# Patient Record
Sex: Female | Born: 1990 | Race: White | Hispanic: No | Smoking: Current every day smoker
Health system: Southern US, Community
[De-identification: ages and names within clinical notes are randomized; demographics above are authoritative.]

## PROBLEM LIST (undated history)

## (undated) ENCOUNTER — Inpatient Hospital Stay (HOSPITAL_COMMUNITY): Payer: Self-pay

## (undated) DIAGNOSIS — F32A Depression, unspecified: Secondary | ICD-10-CM

## (undated) DIAGNOSIS — F419 Anxiety disorder, unspecified: Secondary | ICD-10-CM

## (undated) DIAGNOSIS — F329 Major depressive disorder, single episode, unspecified: Secondary | ICD-10-CM

---

## 2016-09-05 ENCOUNTER — Emergency Department (HOSPITAL_BASED_OUTPATIENT_CLINIC_OR_DEPARTMENT_OTHER): Payer: 59

## 2016-09-05 ENCOUNTER — Emergency Department (HOSPITAL_BASED_OUTPATIENT_CLINIC_OR_DEPARTMENT_OTHER)
Admission: EM | Admit: 2016-09-05 | Discharge: 2016-09-06 | Disposition: A | Discharge status: Home / Self Care | Payer: 59 | Attending: Emergency Medicine | Admitting: Emergency Medicine

## 2016-09-05 ENCOUNTER — Encounter (HOSPITAL_BASED_OUTPATIENT_CLINIC_OR_DEPARTMENT_OTHER): Payer: Self-pay | Admitting: Emergency Medicine

## 2016-09-05 DIAGNOSIS — N83201 Unspecified ovarian cyst, right side: Secondary | ICD-10-CM

## 2016-09-05 DIAGNOSIS — F17200 Nicotine dependence, unspecified, uncomplicated: Secondary | ICD-10-CM | POA: Diagnosis not present

## 2016-09-05 DIAGNOSIS — R102 Pelvic and perineal pain: Secondary | ICD-10-CM | POA: Diagnosis present

## 2016-09-05 HISTORY — DX: Anxiety disorder, unspecified: F41.9

## 2016-09-05 HISTORY — DX: Depression, unspecified: F32.A

## 2016-09-05 HISTORY — DX: Major depressive disorder, single episode, unspecified: F32.9

## 2016-09-05 LAB — URINALYSIS, ROUTINE W REFLEX MICROSCOPIC
BILIRUBIN URINE: NEGATIVE
GLUCOSE, UA: NEGATIVE mg/dL
HGB URINE DIPSTICK: NEGATIVE
Ketones, ur: NEGATIVE mg/dL
Leukocytes, UA: NEGATIVE
Nitrite: NEGATIVE
PH: 6 (ref 5.0–8.0)
Protein, ur: NEGATIVE mg/dL
SPECIFIC GRAVITY, URINE: 1.028 (ref 1.005–1.030)

## 2016-09-05 LAB — PREGNANCY, URINE: PREG TEST UR: NEGATIVE

## 2016-09-05 LAB — WET PREP, GENITAL
Sperm: NONE SEEN
TRICH WET PREP: NONE SEEN
YEAST WET PREP: NONE SEEN

## 2016-09-05 NOTE — ED Provider Notes (Signed)
[facility] Provider Note   CSN: [id number] Arrival date & time: 09/05/16  2307 By signing my name below, I, Levon Hedger, attest that this documentation has been prepared under the direction and in the presence of Paula Libra, MD . Electronically Signed: Levon Hedger, Scribe. 09/06/2016. 1:05 AM.   History   Chief Complaint Chief Complaint  Patient presents with  . Pelvic Pain    HPI Cindy Harris is a 26 y.o. female with About a 24 hour history of waxing and waning suprapubic pain. She describes her pain as severe at times, worse with palpation, movement and ambulation. Pt has never experienced pain like this before. No alleviating factors noted. She states her ex-boyfriend was positive for chlamydia; pt was seen and treated for chlamydia and gonorrhea two weeks ago at [facility]; her STD probe was positive for chlamydia. Pt denies vaginal bleeding, discharge, dysuria, vomiting, diarrhea, fever and chills.   The history is provided by the patient. No language interpreter was used.   Past Medical History:  Diagnosis Date  . Anxiety   . Depression     There are no active problems to display for this patient.   History reviewed. No pertinent surgical history.  OB History    No data available       Home Medications    Prior to Admission medications   Not on File    Family History No family history on file.  Social History Social History  Substance Use Topics  . Smoking status: Current Every Day Smoker    Packs/day: 0.50  . Smokeless tobacco: Never Used  . Alcohol use Yes     Comment: occ     Allergies   Review of patient's allergies indicates no known allergies.   Review of Systems Review of Systems 10 systems reviewed and all are negative for acute change except as noted in the HPI.  Physical Exam Updated Vital Signs BP 100/70 (BP Location: Right Arm)   Pulse 73   Temp 97.9 F (36.6 C) (Oral)   Resp 18   Ht 4\' 11"   (1.499 m)   Wt 90 lb (40.8 kg)   LMP 08/11/2016 (Exact Date)   SpO2 99%   BMI 18.18 kg/m   Physical Exam General: Well-developed, well-nourished female in no acute distress; appearance consistent with age of record HENT: normocephalic; atraumatic Eyes: pupils equal, round and reactive to light; extraocular muscles intact Neck: supple Heart: regular rate and rhythm.  Lungs: clear to auscultation bilaterally Abdomen: soft; nondistended; suprapubic tenderness; no masses or hepatosplenomegaly; bowel sounds present GU: Chaperone (nurse) was present for exam which was performed with no complications; normal external genital; no vaginal bleeding; no vaginal discharge; mild cervical motion tenderness; left adnexal tenderness  Extremities: No deformity; full range of motion; pulses normal Neurologic: Awake, alert and oriented; motor function intact in all extremities and symmetric; no facial droop Skin: Warm and dry Psychiatric: Normal mood and affect   ED Treatments / Results  DIAGNOSTIC STUDIES:  Oxygen Saturation is 100% on RA, normal by my interpretation.    COORDINATION OF CARE:  11:36 PM Will order US pelvis and US transvaginal non-ob. Discussed treatment plan with pt at bedside and pt agreed to plan.   Nursing notes and vitals signs, including pulse oximetry, reviewed.  Summary of this visit's results, reviewed by myself:  Labs:  Results for orders placed or performed during the hospital encounter of 09/05/16 (from the past 24 hour(s))  Urinalysis, Routine w  reflex microscopic (not at [facility])     Status: None   Collection Time: 09/05/16 11:20 PM  Result Value Ref Range   Color, Urine YELLOW YELLOW   APPearance CLEAR CLEAR   Specific Gravity, Urine 1.028 1.005 - 1.030   pH 6.0 5.0 - 8.0   Glucose, UA NEGATIVE NEGATIVE mg/dL   Hgb urine dipstick NEGATIVE NEGATIVE   Bilirubin Urine NEGATIVE NEGATIVE   Ketones, ur NEGATIVE NEGATIVE mg/dL   Protein, ur NEGATIVE NEGATIVE mg/dL    Nitrite NEGATIVE NEGATIVE   Leukocytes, UA NEGATIVE NEGATIVE  Pregnancy, urine     Status: None   Collection Time: 09/05/16 11:20 PM  Result Value Ref Range   Preg Test, Ur NEGATIVE NEGATIVE  Wet prep, genital     Status: Abnormal   Collection Time: 09/05/16 11:30 PM  Result Value Ref Range   Yeast Wet Prep HPF POC NONE SEEN NONE SEEN   Trich, Wet Prep NONE SEEN NONE SEEN   Clue Cells Wet Prep HPF POC PRESENT (A) NONE SEEN   WBC, Wet Prep HPF POC MODERATE (A) NONE SEEN   Sperm NONE SEEN     Imaging Studies: US Transvaginal Non-ob  Result Date: 09/06/2016 CLINICAL DATA:  26 year old female with left pelvic pain. EXAM: TRANSABDOMINAL AND TRANSVAGINAL ULTRASOUND OF PELVIS TECHNIQUE: Both transabdominal and transvaginal ultrasound examinations of the pelvis were performed. Transabdominal technique was performed for global imaging of the pelvis including uterus, ovaries, adnexal regions, and pelvic cul-de-sac. It was necessary to proceed with endovaginal exam following the transabdominal exam to visualize the endometrium and the ovaries. COMPARISON:  None FINDINGS: Uterus Measurements: Retroverted measuring 6 x 4 x 5 cm. No fibroids or other mass visualized. Endometrium Thickness: 1 mm.  No focal abnormality visualized. Right ovary Measurements: 4.0 x 2.7 x 3.5 cm. A 2.3 x 1.4 x 1.7 cm complex cyst/ follicle. Left ovary Measurements: 4.0 x 2.0 x 2.7 cm. Normal appearance/no adnexal mass. Other findings Small amount of complex fluid with low-level echogenic debris noted predominantly surrounding the left ovary. IMPRESSION: Small right ovarian complex/ hemorrhagic cyst and small amount of complex fluid within the pelvis. Unremarkable uterus and left ovary. Electronically Signed   By: Elgie Collard M.D.   On: 09/06/2016 00:26   US Pelvis Complete  Result Date: 09/06/2016 CLINICAL DATA:  26 year old female with left pelvic pain. EXAM: TRANSABDOMINAL AND TRANSVAGINAL ULTRASOUND OF PELVIS TECHNIQUE:  Both transabdominal and transvaginal ultrasound examinations of the pelvis were performed. Transabdominal technique was performed for global imaging of the pelvis including uterus, ovaries, adnexal regions, and pelvic cul-de-sac. It was necessary to proceed with endovaginal exam following the transabdominal exam to visualize the endometrium and the ovaries. COMPARISON:  None FINDINGS: Uterus Measurements: Retroverted measuring 6 x 4 x 5 cm. No fibroids or other mass visualized. Endometrium Thickness: 1 mm.  No focal abnormality visualized. Right ovary Measurements: 4.0 x 2.7 x 3.5 cm. A 2.3 x 1.4 x 1.7 cm complex cyst/ follicle. Left ovary Measurements: 4.0 x 2.0 x 2.7 cm. Normal appearance/no adnexal mass. Other findings Small amount of complex fluid with low-level echogenic debris noted predominantly surrounding the left ovary. IMPRESSION: Small right ovarian complex/ hemorrhagic cyst and small amount of complex fluid within the pelvis. Unremarkable uterus and left ovary. Electronically Signed   By: Elgie Collard M.D.   On: 09/06/2016 00:26    Procedures (including critical care time)   Final Clinical Impressions(s) / ED Diagnoses   Final diagnoses:  Cyst of right ovary  I personally performed the services described in this documentation, which was scribed in my presence. The recorded information has been reviewed and is accurate.    Paula LibraJohn Ragina Fenter, MD 09/06/16 [phone]

## 2016-09-05 NOTE — ED Triage Notes (Signed)
Pt treated 2 weeks ago for Chlamydia.  Today is having severe suprapubic pain that she states "feels like its in my cervix".  Pain worse when walking.

## 2016-09-06 MED ORDER — TRAMADOL HCL 50 MG PO TABS: 50.0000 mg | ORAL_TABLET | Freq: Four times a day (QID) | ORAL | 0 refills | Status: DC | PRN

## 2016-09-06 MED ORDER — TRAMADOL HCL 50 MG PO TABS
50.0000 mg | ORAL_TABLET | Freq: Once | ORAL | Status: AC
  Administered 2016-09-06: 50 mg via ORAL
  Filled 2016-09-06: qty 1

## 2016-09-07 LAB — GC/CHLAMYDIA PROBE AMP (~~LOC~~) NOT AT ARMC
Chlamydia: POSITIVE — AB
NEISSERIA GONORRHEA: NEGATIVE

## 2016-09-10 ENCOUNTER — Telehealth (HOSPITAL_COMMUNITY): Payer: Self-pay

## 2016-09-10 NOTE — Telephone Encounter (Signed)
[facility].  (+) Chlamydia.  No antibiotic treatment or prescription given for STD.  Chart to MD office for review.  [facility] form attached.

## 2016-09-12 ENCOUNTER — Telehealth (HOSPITAL_COMMUNITY): Payer: Self-pay

## 2016-09-12 NOTE — Telephone Encounter (Signed)
Chart reviewed by Dr Fredderick PhenixBelfi "Zithromax 1 gram po"  [facility] form completed and faxed.    LVM requesting call back.  [facility]

## 2016-10-04 ENCOUNTER — Encounter (HOSPITAL_BASED_OUTPATIENT_CLINIC_OR_DEPARTMENT_OTHER): Payer: Self-pay | Admitting: *Deleted

## 2016-10-04 ENCOUNTER — Emergency Department (HOSPITAL_BASED_OUTPATIENT_CLINIC_OR_DEPARTMENT_OTHER)
Admission: EM | Admit: 2016-10-04 | Discharge: 2016-10-04 | Disposition: A | Discharge status: Home / Self Care | Payer: 59 | Attending: Emergency Medicine | Admitting: Emergency Medicine

## 2016-10-04 DIAGNOSIS — M542 Cervicalgia: Secondary | ICD-10-CM | POA: Diagnosis present

## 2016-10-04 DIAGNOSIS — F17200 Nicotine dependence, unspecified, uncomplicated: Secondary | ICD-10-CM | POA: Insufficient documentation

## 2016-10-04 DIAGNOSIS — M436 Torticollis: Secondary | ICD-10-CM | POA: Insufficient documentation

## 2016-10-04 MED ORDER — OXYCODONE HCL 5 MG PO TABS
5.0000 mg | ORAL_TABLET | Freq: Once | ORAL | Status: AC
  Administered 2016-10-04: 5 mg via ORAL
  Filled 2016-10-04: qty 1

## 2016-10-04 MED ORDER — ACETAMINOPHEN 500 MG PO TABS
1000.0000 mg | ORAL_TABLET | Freq: Once | ORAL | Status: AC
  Administered 2016-10-04: 1000 mg via ORAL
  Filled 2016-10-04: qty 2

## 2016-10-04 MED ORDER — IBUPROFEN 800 MG PO TABS
800.0000 mg | ORAL_TABLET | Freq: Once | ORAL | Status: AC
  Administered 2016-10-04: 800 mg via ORAL
  Filled 2016-10-04: qty 1

## 2016-10-04 MED ORDER — DIAZEPAM 5 MG PO TABS
5.0000 mg | ORAL_TABLET | Freq: Once | ORAL | Status: AC
  Administered 2016-10-04: 5 mg via ORAL
  Filled 2016-10-04: qty 1

## 2016-10-04 NOTE — ED Triage Notes (Signed)
Pain in her left neck when she moves her neck. Pain x 4 days. No fever. She feels like she slept wrong but the pain is not getting better with time.

## 2016-10-04 NOTE — ED Provider Notes (Signed)
[facility]   CSN: [id number] Arrival date & time: 10/04/16  2046  By signing my name below, I, Emmanuella Mensah, attest that this documentation has been prepared under the direction and in the presence of Melene Planan Kendale Rembold, DO. Electronically Signed: Angelene GiovanniEmmanuella Mensah, ED Scribe. 10/04/16. 9:36 PM.   History   Chief Complaint Chief Complaint  Patient presents with  . Neck Pain    HPI Comments: Cindy Harris is a 26 y.o. female who presents to the Emergency Department complaining of gradually worsening moderate left lateral neck pain with stiffness onset 3 days ago. She reports associated pain with ROM and intermittent left shoulder pain but pt states that could be due to the way she is holding her head up. No alleviating factors noted. Pt has not tried any medications PTA. She denies any recent falls, injuries, or trauma but states that she initially believed that she slept on her neck the wrong way. Pt has NKDA. She denies any fever, chills, or headaches.  The history is provided by the patient. No language interpreter was used.  Neck Pain   This is a new problem. The current episode started more than 2 days ago. The problem has been gradually worsening. There has been no fever. The pain is present in the left side. The pain is moderate. The pain is the same all the time. Stiffness is present all day. Pertinent negatives include no chest pain and no headaches. She has tried nothing for the symptoms.    Past Medical History:  Diagnosis Date  . Anxiety   . Depression     There are no active problems to display for this patient.   History reviewed. No pertinent surgical history.  OB History    No data available       Home Medications    Prior to Admission medications   Medication Sig Start Date End Date Taking? Authorizing Provider  traMADol (ULTRAM) 50 MG tablet Take 1-2 tablets (50-100 mg total) by mouth every 6 (six) hours as needed (for pain).  09/06/16   Paula LibraJohn Molpus, MD    Family History No family history on file.  Social History Social History  Substance Use Topics  . Smoking status: Current Every Day Smoker    Packs/day: 0.50  . Smokeless tobacco: Never Used  . Alcohol use Yes     Comment: occ     Allergies   Review of patient's allergies indicates no known allergies.   Review of Systems Review of Systems  Constitutional: Negative for chills and fever.  HENT: Negative for congestion and rhinorrhea.   Eyes: Negative for redness and visual disturbance.  Respiratory: Negative for shortness of breath and wheezing.   Cardiovascular: Negative for chest pain and palpitations.  Gastrointestinal: Negative for nausea and vomiting.  Genitourinary: Negative for dysuria and urgency.  Musculoskeletal: Positive for neck pain. Negative for arthralgias and myalgias.  Skin: Negative for pallor and wound.  Neurological: Negative for dizziness and headaches.     Physical Exam Updated Vital Signs BP 112/72   Pulse 90   Temp 97.6 F (36.4 C) (Oral)   Resp 16   Ht 4\' 11"  (1.499 m)   Wt 94 lb (42.6 kg)   LMP 09/08/2016   SpO2 98%   BMI 18.99 kg/m   Physical Exam  Constitutional: She is oriented to person, place, and time. She appears well-developed and well-nourished. No distress.  HENT:  Head: Normocephalic and atraumatic.  Eyes: EOM are normal. Pupils are  equal, round, and reactive to light.  Neck: Normal range of motion. Neck supple.  Cardiovascular: Normal rate and regular rhythm.  Exam reveals no gallop and no friction rub.   No murmur heard. Pulmonary/Chest: Effort normal. She has no wheezes. She has no rales.  Abdominal: Soft. She exhibits no distension. There is no tenderness.  Musculoskeletal: She exhibits tenderness. She exhibits no edema.  Tenderness and spasm about left SEM. No midline spinal tenderness  Neurological: She is alert and oriented to person, place, and time.  Skin: Skin is warm and dry. She  is not diaphoretic.  Psychiatric: She has a normal mood and affect. Her behavior is normal.  Nursing note and vitals reviewed.    ED Treatments / Results  DIAGNOSTIC STUDIES: Oxygen Saturation is 98% on RA, normal by my interpretation.    COORDINATION OF CARE: 9:34 PM- Pt advised of plan for treatment and pt agrees. Pt will receive Valium. Will provide resources for neck exercises. Advised to follow up with PCP.    Labs (all labs ordered are listed, but only abnormal results are displayed) Labs Reviewed - No data to display  EKG  EKG Interpretation None       Radiology No results found.  Procedures Procedures (including critical care time)  Medications Ordered in ED Medications  acetaminophen (TYLENOL) tablet 1,000 mg (1,000 mg Oral Given 10/04/16 2141)  ibuprofen (ADVIL,MOTRIN) tablet 800 mg (800 mg Oral Given 10/04/16 2141)  oxyCODONE (Oxy IR/ROXICODONE) immediate release tablet 5 mg (5 mg Oral Given 10/04/16 2141)  diazepam (VALIUM) tablet 5 mg (5 mg Oral Given 10/04/16 2141)     Initial Impression / Assessment and Plan / ED Course  Melene Plan, DO has reviewed the triage vital signs and the nursing notes.  Pertinent labs & imaging results that were available during my care of the patient were reviewed by me and considered in my medical decision making (see chart for details).  Clinical Course    26 yo F With torticollis. Going on for the past few days. Patient with significant pain and spasm to the left SCM. Attempted a muscle energy maneuver but the patient is unable to tolerate, will d/c home.   9:48 PM:  I have discussed the diagnosis/risks/treatment options with the patient and family and believe the pt to be eligible for discharge home to follow-up with PCP. We also discussed returning to the ED immediately if new or worsening sx occur. We discussed the sx which are most concerning (e.g., sudden worsening pain, fever, inability to tolerate by mouth) that  necessitate immediate return. Medications administered to the patient during their visit and any new prescriptions provided to the patient are listed below.  Medications given during this visit Medications  acetaminophen (TYLENOL) tablet 1,000 mg (1,000 mg Oral Given 10/04/16 2141)  ibuprofen (ADVIL,MOTRIN) tablet 800 mg (800 mg Oral Given 10/04/16 2141)  oxyCODONE (Oxy IR/ROXICODONE) immediate release tablet 5 mg (5 mg Oral Given 10/04/16 2141)  diazepam (VALIUM) tablet 5 mg (5 mg Oral Given 10/04/16 2141)     The patient appears reasonably screen and/or stabilized for discharge and I doubt any other medical condition or other [facility] requiring further screening, evaluation, or treatment in the ED at this time prior to discharge.    Final Clinical Impressions(s) / ED Diagnoses   Final diagnoses:  Torticollis, acute    New Prescriptions Discharge Medication List as of 10/04/2016  9:35 PM      I personally performed the services described in this  documentation, which was scribed in my presence. The recorded information has been reviewed and is accurate.     Melene Plan, DO 10/04/16 2148

## 2016-10-04 NOTE — Discharge Instructions (Signed)
Take 4 over the counter ibuprofen tablets 3 times a day or 2 over-the-counter naproxen tablets twice a day for pain. Also take tylenol 1000mg(2 extra strength) four times a day.    

## 2016-11-07 ENCOUNTER — Emergency Department (HOSPITAL_BASED_OUTPATIENT_CLINIC_OR_DEPARTMENT_OTHER)
Admission: EM | Admit: 2016-11-07 | Discharge: 2016-11-07 | Disposition: A | Discharge status: Home / Self Care | Payer: 59 | Attending: Emergency Medicine | Admitting: Emergency Medicine

## 2016-11-07 ENCOUNTER — Encounter (HOSPITAL_BASED_OUTPATIENT_CLINIC_OR_DEPARTMENT_OTHER): Payer: Self-pay

## 2016-11-07 DIAGNOSIS — R05 Cough: Secondary | ICD-10-CM | POA: Diagnosis not present

## 2016-11-07 DIAGNOSIS — R0981 Nasal congestion: Secondary | ICD-10-CM | POA: Diagnosis not present

## 2016-11-07 DIAGNOSIS — R0602 Shortness of breath: Secondary | ICD-10-CM | POA: Insufficient documentation

## 2016-11-07 DIAGNOSIS — F17200 Nicotine dependence, unspecified, uncomplicated: Secondary | ICD-10-CM | POA: Diagnosis not present

## 2016-11-07 DIAGNOSIS — R059 Cough, unspecified: Secondary | ICD-10-CM

## 2016-11-07 MED ORDER — DM-GUAIFENESIN ER 30-600 MG PO TB12: 1.0000 | ORAL_TABLET | Freq: Two times a day (BID) | ORAL | 0 refills | Status: DC | PRN

## 2016-11-07 NOTE — ED Triage Notes (Signed)
Prod cough started last night-NAD-steady gait

## 2016-11-07 NOTE — ED Provider Notes (Signed)
[facility]   CSN: [id number] Arrival date & time: 11/07/16  2138  History   Chief Complaint Chief Complaint  Patient presents with  . Cough   HPI Cindy Harris is a 26 y.o. female.  HPI  Reports productive cough since last night. Denies fever, stating temperature has been around 99-100F. Denies nausea, vomiting, diarrhea, constipation. Smokes 1ppd. Not interested in quitting at this time given increased family stress.   Past Medical History:  Diagnosis Date  . Anxiety   . Depression     There are no active problems to display for this patient.   History reviewed. No pertinent surgical history.  OB History    No data available     Home Medications    Prior to Admission medications   Medication Sig Start Date End Date Taking? Authorizing Provider  dextromethorphan-guaiFENesin (MUCINEX DM) 30-600 MG 12hr tablet Take 1 tablet by mouth 2 (two) times daily as needed for cough. 11/07/16   Araceli Bouchealeigh N Kynisha Memon, DO   Family History No family history on file.  Social History Social History  Substance Use Topics  . Smoking status: Current Every Day Smoker    Packs/day: 0.50  . Smokeless tobacco: Never Used  . Alcohol use Yes     Comment: occ   Allergies   Patient has no known allergies.   Review of Systems Review of Systems  Constitutional: Negative for chills and fever.  HENT: Positive for congestion. Negative for rhinorrhea, sinus pressure and sore throat.   Respiratory: Positive for cough and shortness of breath. Negative for wheezing.   Cardiovascular: Negative for chest pain.  Gastrointestinal: Negative for abdominal pain, constipation, diarrhea, nausea and vomiting.     Physical Exam Updated Vital Signs BP 121/72 (BP Location: Left Arm)   Pulse 92   Temp 98.1 F (36.7 C) (Oral)   Resp 16   Ht 4\' 11"  (1.499 m)   Wt 42.6 kg   LMP 10/13/2016   SpO2 100%   BMI 18.99 kg/m   Physical Exam  Constitutional: She appears  well-developed and well-nourished. No distress.  HENT:  Head: Normocephalic and atraumatic.  Cardiovascular: Normal rate and regular rhythm.   No murmur heard. Pulmonary/Chest: Effort normal. No respiratory distress. She has no wheezes. She exhibits no tenderness.  Abdominal: Soft. She exhibits no distension. There is no tenderness.  Lymphadenopathy:    She has no cervical adenopathy.  Skin: No rash noted.  Psychiatric: She has a normal mood and affect. Her behavior is normal.    ED Treatments / Results  Labs (all labs ordered are listed, but only abnormal results are displayed) Labs Reviewed - No data to display  EKG  EKG Interpretation None      Radiology No results found.  Procedures Procedures (including critical care time)  Medications Ordered in ED Medications - No data to display   Initial Impression / Assessment and Plan / ED Course  I have reviewed the triage vital signs and the nursing notes.  Pertinent labs & imaging results that were available during my care of the patient were reviewed by me and considered in my medical decision making (see chart for details).  Clinical Course   No red flag symptoms. Vitals stable.  Final Clinical Impressions(s) / ED Diagnoses   Final diagnoses:  Cough  Suspect viral etiology. Symptomatic treatment with Mucinex. Antibiotics not indicated. Follow up with PCP.  New Prescriptions New Prescriptions   DEXTROMETHORPHAN-GUAIFENESIN (MUCINEX DM) 30-600 MG 12HR TABLET  Take 1 tablet by mouth 2 (two) times daily as needed for cough.     [address] RaviaRumley, [place] 11/07/16 2203    Geoffery Lyonsouglas Delo, MD 11/07/16 2215

## 2016-11-07 NOTE — ED Notes (Signed)
Pt verbalizes understanding of d/c instructions and denies any further needs at this time. 

## 2017-01-27 ENCOUNTER — Encounter (HOSPITAL_BASED_OUTPATIENT_CLINIC_OR_DEPARTMENT_OTHER): Payer: Self-pay | Admitting: Emergency Medicine

## 2017-01-27 DIAGNOSIS — O209 Hemorrhage in early pregnancy, unspecified: Secondary | ICD-10-CM | POA: Diagnosis not present

## 2017-01-27 DIAGNOSIS — R102 Pelvic and perineal pain: Secondary | ICD-10-CM | POA: Insufficient documentation

## 2017-01-27 DIAGNOSIS — F17210 Nicotine dependence, cigarettes, uncomplicated: Secondary | ICD-10-CM | POA: Diagnosis not present

## 2017-01-27 DIAGNOSIS — Z3A01 Less than 8 weeks gestation of pregnancy: Secondary | ICD-10-CM | POA: Insufficient documentation

## 2017-01-27 DIAGNOSIS — O99331 Smoking (tobacco) complicating pregnancy, first trimester: Secondary | ICD-10-CM | POA: Diagnosis not present

## 2017-01-27 DIAGNOSIS — F419 Anxiety disorder, unspecified: Secondary | ICD-10-CM | POA: Diagnosis not present

## 2017-01-27 DIAGNOSIS — R110 Nausea: Secondary | ICD-10-CM | POA: Diagnosis not present

## 2017-01-27 DIAGNOSIS — O26891 Other specified pregnancy related conditions, first trimester: Secondary | ICD-10-CM | POA: Insufficient documentation

## 2017-01-27 DIAGNOSIS — Z79899 Other long term (current) drug therapy: Secondary | ICD-10-CM | POA: Diagnosis not present

## 2017-01-27 DIAGNOSIS — O4691 Antepartum hemorrhage, unspecified, first trimester: Secondary | ICD-10-CM | POA: Diagnosis not present

## 2017-01-27 DIAGNOSIS — O99341 Other mental disorders complicating pregnancy, first trimester: Secondary | ICD-10-CM | POA: Insufficient documentation

## 2017-01-27 LAB — PREGNANCY, URINE: PREG TEST UR: POSITIVE — AB

## 2017-01-27 LAB — URINALYSIS, ROUTINE W REFLEX MICROSCOPIC
BILIRUBIN URINE: NEGATIVE
GLUCOSE, UA: NEGATIVE mg/dL
HGB URINE DIPSTICK: NEGATIVE
KETONES UR: NEGATIVE mg/dL
LEUKOCYTES UA: NEGATIVE
Nitrite: NEGATIVE
PH: 6 (ref 5.0–8.0)
PROTEIN: NEGATIVE mg/dL
Specific Gravity, Urine: 1.013 (ref 1.005–1.030)

## 2017-01-27 NOTE — ED Triage Notes (Signed)
Patient report sthat she is [redacted] weeks pregnant and is having vaginal bleeding   - she reports that she noticed this afternoon when she wiped after going to the restroom. Lower right side cramping

## 2017-01-27 NOTE — ED Notes (Signed)
Urine sent to lab 

## 2017-01-28 ENCOUNTER — Inpatient Hospital Stay (HOSPITAL_BASED_OUTPATIENT_CLINIC_OR_DEPARTMENT_OTHER)
Admission: EM | Admit: 2017-01-28 | Discharge: 2017-01-28 | Disposition: A | Discharge status: Home / Self Care | Payer: 59 | Attending: Emergency Medicine | Admitting: Emergency Medicine

## 2017-01-28 ENCOUNTER — Encounter (HOSPITAL_COMMUNITY): Payer: Self-pay | Admitting: Certified Nurse Midwife

## 2017-01-28 ENCOUNTER — Inpatient Hospital Stay (HOSPITAL_COMMUNITY): Payer: 59

## 2017-01-28 DIAGNOSIS — O209 Hemorrhage in early pregnancy, unspecified: Secondary | ICD-10-CM | POA: Diagnosis not present

## 2017-01-28 DIAGNOSIS — O26891 Other specified pregnancy related conditions, first trimester: Secondary | ICD-10-CM

## 2017-01-28 DIAGNOSIS — R110 Nausea: Secondary | ICD-10-CM

## 2017-01-28 DIAGNOSIS — O4691 Antepartum hemorrhage, unspecified, first trimester: Secondary | ICD-10-CM

## 2017-01-28 DIAGNOSIS — O4690 Antepartum hemorrhage, unspecified, unspecified trimester: Secondary | ICD-10-CM

## 2017-01-28 DIAGNOSIS — Z3A01 Less than 8 weeks gestation of pregnancy: Secondary | ICD-10-CM

## 2017-01-28 DIAGNOSIS — R102 Pelvic and perineal pain: Secondary | ICD-10-CM

## 2017-01-28 DIAGNOSIS — Z3490 Encounter for supervision of normal pregnancy, unspecified, unspecified trimester: Secondary | ICD-10-CM

## 2017-01-28 LAB — WET PREP, GENITAL
CLUE CELLS WET PREP: NONE SEEN
SPERM: NONE SEEN
TRICH WET PREP: NONE SEEN
YEAST WET PREP: NONE SEEN

## 2017-01-28 LAB — CBC
HCT: 38.4 % (ref 36.0–46.0)
HEMOGLOBIN: 13.6 g/dL (ref 12.0–15.0)
MCH: 31.8 pg (ref 26.0–34.0)
MCHC: 35.4 g/dL (ref 30.0–36.0)
MCV: 89.7 fL (ref 78.0–100.0)
Platelets: 233 10*3/uL (ref 150–400)
RBC: 4.28 MIL/uL (ref 3.87–5.11)
RDW: 11.7 % (ref 11.5–15.5)
WBC: 8 10*3/uL (ref 4.0–10.5)

## 2017-01-28 LAB — ABO/RH: ABO/RH(D): O POS

## 2017-01-28 LAB — HCG, QUANTITATIVE, PREGNANCY: HCG, BETA CHAIN, QUANT, S: 7909 m[IU]/mL — AB (ref <5)

## 2017-01-28 MED ORDER — ACETAMINOPHEN 500 MG PO TABS
1000.0000 mg | ORAL_TABLET | Freq: Once | ORAL | Status: AC
Start: 2017-01-28 — End: 2017-01-28
  Administered 2017-01-28: 1000 mg via ORAL
  Filled 2017-01-28: qty 2

## 2017-01-28 NOTE — MAU Provider Note (Signed)
History     CSN: [id number]  Arrival date and time: 01/27/17 2111   First Provider Initiated Contact with Patient 01/28/17 0315      Chief Complaint  Patient presents with  . Vaginal Bleeding   Patient sent from [facility] for ultrasound.    Vaginal Bleeding  The patient's primary symptoms include pelvic pain and vaginal bleeding. This is a new problem. The current episode started today. The problem occurs constantly. The problem has been unchanged. Pain severity now: 6/10. The problem affects the right side. She is pregnant. Associated symptoms include nausea. Pertinent negatives include no chills, dysuria, fever, frequency, urgency or vomiting. The vaginal discharge was bloody. The vaginal bleeding is spotting. She has not been passing clots. She has not been passing tissue. Nothing aggravates the symptoms. She has tried acetaminophen for the symptoms. The treatment provided mild relief. Her menstrual history has been regular (LMP 12/21/16 ).    Past Medical History:  Diagnosis Date  . Anxiety   . Depression     History reviewed. No pertinent surgical history.  History reviewed. No pertinent family history.  Social History  Substance Use Topics  . Smoking status: Current Every Day Smoker    Packs/day: 0.50  . Smokeless tobacco: Never Used  . Alcohol use Yes     Comment: occ    Allergies: No Known Allergies  Prescriptions Prior to Admission  Medication Sig Dispense Refill Last Dose  . dextromethorphan-guaiFENesin (MUCINEX DM) 30-600 MG 12hr tablet Take 1 tablet by mouth 2 (two) times daily as needed for cough. 15 tablet 0     Review of Systems  Constitutional: Negative for chills and fever.  Gastrointestinal: Positive for nausea. Negative for vomiting.  Genitourinary: Positive for pelvic pain and vaginal bleeding. Negative for dysuria, frequency and urgency.   Physical Exam   Blood pressure 108/66, pulse 88, temperature 97.7 F (36.5 C), temperature source Oral,  resp. rate 16, height 4\' 11"  (1.499 m), weight 93 lb (42.2 kg), last menstrual period 10/13/2016, SpO2 100 %.  Physical Exam  Nursing note and vitals reviewed. Constitutional: She is oriented to person, place, and time. She appears well-developed and well-nourished. No distress.  HENT:  Head: Normocephalic.  Cardiovascular: Normal rate.   Respiratory: Effort normal.  GI: Soft. There is no tenderness.  Neurological: She is alert and oriented to person, place, and time.  Skin: Skin is warm and dry.  Psychiatric: She has a normal mood and affect.    Results for orders placed or performed during the hospital encounter of 01/28/17 (from the past 24 hour(s))  Pregnancy, urine     Status: Abnormal   Collection Time: 01/27/17  9:48 PM  Result Value Ref Range   Preg Test, Ur POSITIVE (A) NEGATIVE  Urinalysis, Routine w reflex microscopic     Status: None   Collection Time: 01/27/17  9:48 PM  Result Value Ref Range   Color, Urine YELLOW YELLOW   APPearance CLEAR CLEAR   Specific Gravity, Urine 1.013 1.005 - 1.030   pH 6.0 5.0 - 8.0   Glucose, UA NEGATIVE NEGATIVE mg/dL   Hgb urine dipstick NEGATIVE NEGATIVE   Bilirubin Urine NEGATIVE NEGATIVE   Ketones, ur NEGATIVE NEGATIVE mg/dL   Protein, ur NEGATIVE NEGATIVE mg/dL   Nitrite NEGATIVE NEGATIVE   Leukocytes, UA NEGATIVE NEGATIVE  Wet prep, genital     Status: Abnormal   Collection Time: 01/28/17  1:28 AM  Result Value Ref Range   Yeast Wet Prep HPF POC NONE  SEEN NONE SEEN   Trich, Wet Prep NONE SEEN NONE SEEN   Clue Cells Wet Prep HPF POC NONE SEEN NONE SEEN   WBC, Wet Prep HPF POC MODERATE (A) NONE SEEN   Sperm NONE SEEN   hCG, quantitative, pregnancy     Status: Abnormal   Collection Time: 01/28/17  2:12 AM  Result Value Ref Range   hCG, Beta Chain, Quant, S 7,909 (H) <5 mIU/mL  CBC     Status: None   Collection Time: 01/28/17  2:12 AM  Result Value Ref Range   WBC 8.0 4.0 - 10.5 K/uL   RBC 4.28 3.87 - 5.11 MIL/uL    Hemoglobin 13.6 12.0 - 15.0 g/dL   HCT 40.9 81.1 - 91.4 %   MCV 89.7 78.0 - 100.0 fL   MCH 31.8 26.0 - 34.0 pg   MCHC 35.4 30.0 - 36.0 g/dL   RDW 78.2 95.6 - 21.3 %   Platelets 233 150 - 400 K/uL  ABO/Rh     Status: None (Preliminary result)   Collection Time: 01/28/17  3:21 AM  Result Value Ref Range   ABO/RH(D) O POS     US Ob Comp Less 14 Wks  Result Date: 01/28/2017 CLINICAL DATA:  Pregnant patient in first-trimester pregnancy with pelvic pain. Vaginal bleeding. EXAM: OBSTETRIC <14 WK [place] AND TRANSVAGINAL OB US TECHNIQUE: Both transabdominal and transvaginal ultrasound examinations were performed for complete evaluation of the gestation as well as the maternal uterus, adnexal regions, and pelvic cul-de-sac. Transvaginal technique was performed to assess early pregnancy. COMPARISON:  None. FINDINGS: Intrauterine gestational sac: Single Yolk sac:  Visualized. Embryo:  Not Visualized. Cardiac Activity: Not Visualized. MSD: 7.4  mm   5 w   3  d Subchorionic hemorrhage:  None visualized. Maternal uterus/adnexae: Both ovaries are visualized and are normal. Probable corpus luteal cyst on the left ovary. No pelvic free fluid. IMPRESSION: Probable early intrauterine gestational sac containing a yolk sac, but no fetal pole or cardiac activity yet visualized. Recommend follow-up quantitative B-HCG levels and follow-up US in 10-14 days to assess viability. This recommendation follows SRU consensus guidelines: Diagnostic Criteria for Nonviable Pregnancy Early in the First Trimester. Malva Limes Med 2013; 086:5784-69. Electronically Signed   By: Rubye Oaks M.D.   On: 01/28/2017 04:14   US Ob Transvaginal  Result Date: 01/28/2017 CLINICAL DATA:  Pregnant patient in first-trimester pregnancy with pelvic pain. Vaginal bleeding. EXAM: OBSTETRIC <14 WK [place] AND TRANSVAGINAL OB US TECHNIQUE: Both transabdominal and transvaginal ultrasound examinations were performed for complete evaluation of the gestation as  well as the maternal uterus, adnexal regions, and pelvic cul-de-sac. Transvaginal technique was performed to assess early pregnancy. COMPARISON:  None. FINDINGS: Intrauterine gestational sac: Single Yolk sac:  Visualized. Embryo:  Not Visualized. Cardiac Activity: Not Visualized. MSD: 7.4  mm   5 w   3  d Subchorionic hemorrhage:  None visualized. Maternal uterus/adnexae: Both ovaries are visualized and are normal. Probable corpus luteal cyst on the left ovary. No pelvic free fluid. IMPRESSION: Probable early intrauterine gestational sac containing a yolk sac, but no fetal pole or cardiac activity yet visualized. Recommend follow-up quantitative B-HCG levels and follow-up US in 10-14 days to assess viability. This recommendation follows SRU consensus guidelines: Diagnostic Criteria for Nonviable Pregnancy Early in the First Trimester. Malva Limes Med 2013; 629:5284-13. Electronically Signed   By: Rubye Oaks M.D.   On: 01/28/2017 04:14   MAU Course  Procedures  MDM   Assessment  and Plan   1. Vaginal bleeding in pregnancy   2. Pelvic pain in pregnancy, antepartum, first trimester   3. Intrauterine pregnancy   4. [redacted] weeks gestation of pregnancy    DC home Comfort measures reviewed  1st Trimester precautions  Bleeding precautions RX: none  Return to MAU as needed FU with OB as planned  Follow-up Information    [facility] [place] Follow up.   Specialty:  Obstetrics and Gynecology Why:  Make an appointment for prenatal care.  Contact information: [address] [place] [phone]           Tawnya CrookHogan, Heather Donovan 01/28/2017, 3:15 AM

## 2017-01-28 NOTE — ED Notes (Signed)
ED Provider at bedside. 

## 2017-01-28 NOTE — ED Provider Notes (Signed)
TIME SEEN: 1:30 AM  CHIEF COMPLAINT: Abdominal pain, vaginal bleeding, pregnant  HPI: Pt is a 27 y.o. female who is a G3 P2 who is 5 weeks and 3 days pregnant by last menstrual period of December 22 who presents emergency department with right lower quadrant sharp abdominal pain without radiation that started around 6 PM while at rest and then spotting vaginal bleeding that started at 7:45 PM yesterday. She has had some nausea with this pregnancy but no recent fevers, chills, vomiting, diarrhea, dysuria or hematuria, abnormal vaginal discharge. States she has had a history of chlamydia and was treated in August. No history of ectopics, miscarriages or abortions. Has not had an ultrasound confirming this pregnancy. Does not yet have an OB/GYN in this area. States that [place]  ROS: See HPI Constitutional: no fever  Eyes: no drainage  ENT: no runny nose   Cardiovascular:  no chest pain  Resp: no SOB  GI: no vomiting GU: no dysuria Integumentary: no rash  Allergy: no hives  Musculoskeletal: no leg swelling  Neurological: no slurred speech ROS otherwise negative  PAST MEDICAL HISTORY/PAST SURGICAL HISTORY:  Past Medical History:  Diagnosis Date  . Anxiety   . Depression     MEDICATIONS:  Prior to Admission medications   Medication Sig Start Date End Date Taking? Authorizing Provider  dextromethorphan-guaiFENesin (MUCINEX DM) 30-600 MG 12hr tablet Take 1 tablet by mouth 2 (two) times daily as needed for cough. 11/07/16   Mead Bonnell PublicN Rumley, DO    ALLERGIES:  No Known Allergies  SOCIAL HISTORY:  Social History  Substance Use Topics  . Smoking status: Current Every Day Smoker    Packs/day: 0.50  . Smokeless tobacco: Never Used  . Alcohol use Yes     Comment: occ    FAMILY HISTORY: History reviewed. No pertinent family history.  EXAM: BP 106/75 (BP Location: Right Arm)   Pulse 72   Temp 97.6 F (36.4 C) (Oral)    Resp 18   Ht 4\' 11"  (1.499 m)   Wt 93 lb (42.2 kg)   LMP 10/13/2016   SpO2 100%   BMI 18.78 kg/m  CONSTITUTIONAL: Alert and oriented and responds appropriately to questions. Well-appearing; well-nourished HEAD: Normocephalic EYES: Conjunctivae clear ENT: normal nose; no rhinorrhea; moist mucous membranes NECK: Supple, no meningismus, no nuchal rigidity, no LAD  CARD: RRR; S1 and S2 appreciated; no murmurs, no clicks, no rubs, no gallops RESP: Normal chest excursion without splinting or tachypnea; breath sounds clear and equal bilaterally; no wheezes, no rhonchi, no rales, no hypoxia or respiratory distress, speaking full sentences ABD/GI: Normal bowel sounds; non-distended; soft, Mild tenderness in the right pelvic region but no tenderness at McBurney's point, no rebound, no guarding, no peritoneal signs, no hepatosplenomegaly GU:  Normal external genitalia. No lesions, rashes noted. Patient has no appreciable vaginal bleeding on exam. Small amount of thick yellow vaginal discharge coming from the cervical os.  Patient does have right-sided adnexal tenderness on exam without mass or fullness. No left-sided adnexal tenderness, mass or fullness, no cervical motion tenderness. Cervix is not appear friable.  Cervix is closed.  Chaperone present for exam. BACK:  The back appears normal and is non-tender to palpation, there is no CVA tenderness EXT: Normal ROM in all joints; non-tender to palpation; no edema; normal capillary refill; no cyanosis, no calf tenderness or swelling    SKIN: Normal color for age and race; warm; no rash NEURO: Moves  all extremities equally PSYCH: The patient's mood and manner are appropriate. Grooming and personal hygiene are appropriate.  MEDICAL DECISION MAKING: Patient here with right adnexal pain. She is pregnant. Urine shows no sign of infection or ketones. Abdominal exam benign other than mild pelvic, adnexal tenderness. Doubt appendicitis. Nothing to suggest PID. I  feel she will need an ultrasound to evaluate for ectopic given unilateral pain with bleeding. No significant bleeding currently. Hemodynamically stable. We'll give Tylenol for pain. Blood work (CBC, ABO type, beta quant), pelvic cultures pending and can be followed up at [facility] hospital. Discussed with Dr. Macon Large at [facility] who agrees to accept the patient in transfer for ultrasound.  Patient and significant other would like to go by private vehicle which I feel is appropriate given she is well-appearing, appears stable. She knows to go straight to [facility] hospital for evaluation.  I reviewed all nursing notes, vitals, pertinent old records, EKGs, labs, imaging (as available).      Layla Maw Ariya Bohannon, DO 01/28/17 [phone]

## 2017-01-28 NOTE — ED Notes (Signed)
Pt states she noticed some RLQ pain earlier today. Went to [facility] and noticed some blood when wiping. Denies other s/s. G3P2 Has not seen anyone for this pregnancy as of yet.

## 2017-01-28 NOTE — Discharge Instructions (Signed)
First Trimester of Pregnancy  The first trimester of pregnancy is from week 1 until the end of week 12 (months 1 through 3). A week after a sperm fertilizes an egg, the egg will implant on the wall of the uterus. This embryo will begin to develop into a baby. Genes from you and your partner are forming the baby. The female genes determine whether the baby is a boy or a girl. At 6-8 weeks, the eyes and face are formed, and the heartbeat can be seen on ultrasound. At the end of 12 weeks, all the baby's organs are formed.   Now that you are pregnant, you will want to do everything you can to have a healthy baby. Two of the most important things are to get good prenatal care and to follow your health care provider's instructions. Prenatal care is all the medical care you receive before the baby's birth. This care will help prevent, find, and treat any problems during the pregnancy and childbirth.  BODY CHANGES  Your body goes through many changes during pregnancy. The changes vary from woman to woman.   · You may gain or lose a couple of pounds at first.  · You may feel sick to your stomach (nauseous) and throw up (vomit). If the vomiting is uncontrollable, call your health care provider.  · You may tire easily.  · You may develop headaches that can be relieved by medicines approved by your health care provider.  · You may urinate more often. Painful urination may mean you have a bladder infection.  · You may develop heartburn as a result of your pregnancy.  · You may develop constipation because certain hormones are causing the muscles that push waste through your intestines to slow down.  · You may develop hemorrhoids or swollen, bulging veins (varicose veins).  · Your breasts may begin to grow larger and become tender. Your nipples may stick out more, and the tissue that surrounds them (areola) may become darker.  · Your gums may bleed and may be sensitive to brushing and flossing.   · Dark spots or blotches (chloasma, mask of pregnancy) may develop on your face. This will likely fade after the baby is born.  · Your menstrual periods will stop.  · You may have a loss of appetite.  · You may develop cravings for certain kinds of food.  · You may have changes in your emotions from day to day, such as being excited to be pregnant or being concerned that something may go wrong with the pregnancy and baby.  · You may have more vivid and strange dreams.  · You may have changes in your hair. These can include thickening of your hair, rapid growth, and changes in texture. Some women also have hair loss during or after pregnancy, or hair that feels dry or thin. Your hair will most likely return to normal after your baby is born.  WHAT TO EXPECT AT YOUR PRENATAL VISITS  During a routine prenatal visit:  · You will be weighed to make sure you and the baby are growing normally.  · Your blood pressure will be taken.  · Your abdomen will be measured to track your baby's growth.  · The fetal heartbeat will be listened to starting around week 10 or 12 of your pregnancy.  · Test results from any previous visits will be discussed.  Your health care provider may ask you:  · How you are feeling.  · If you   are feeling the baby move.  · If you have had any abnormal symptoms, such as leaking fluid, bleeding, severe headaches, or abdominal cramping.  · If you are using any tobacco products, including cigarettes, chewing tobacco, and electronic cigarettes.  · If you have any questions.  Other tests that may be performed during your first trimester include:  · Blood tests to find your blood type and to check for the presence of any previous infections. They will also be used to check for low iron levels (anemia) and Rh antibodies. Later in the pregnancy, blood tests for diabetes will be done along with other tests if problems develop.  · Urine tests to check for infections, diabetes, or protein in the urine.   · An ultrasound to confirm the proper growth and development of the baby.  · An amniocentesis to check for possible genetic problems.  · Fetal screens for spina bifida and Down syndrome.  · You may need other tests to make sure you and the baby are doing well.  · HIV (human immunodeficiency virus) testing. Routine prenatal testing includes screening for HIV, unless you choose not to have this test.  HOME CARE INSTRUCTIONS   Medicines  · Follow your health care provider's instructions regarding medicine use. Specific medicines may be either safe or unsafe to take during pregnancy.  · Take your prenatal vitamins as directed.  · If you develop constipation, try taking a stool softener if your health care provider approves.  Diet  · Eat regular, well-balanced meals. Choose a variety of foods, such as meat or vegetable-based protein, fish, milk and low-fat dairy products, vegetables, fruits, and whole grain breads and cereals. Your health care provider will help you determine the amount of weight gain that is right for you.  · Avoid raw meat and uncooked cheese. These carry germs that can cause birth defects in the baby.  · Eating four or five small meals rather than three large meals a day may help relieve nausea and vomiting. If you start to feel nauseous, eating a few soda crackers can be helpful. Drinking liquids between meals instead of during meals also seems to help nausea and vomiting.  · If you develop constipation, eat more high-fiber foods, such as fresh vegetables or fruit and whole grains. Drink enough fluids to keep your urine clear or pale yellow.  Activity and Exercise  · Exercise only as directed by your health care provider. Exercising will help you:    Control your weight.    Stay in shape.    Be prepared for labor and delivery.  · Experiencing pain or cramping in the lower abdomen or low back is a good sign that you should stop exercising. Check with your health care provider  before continuing normal exercises.  · Try to avoid standing for long periods of time. Move your legs often if you must stand in one place for a long time.  · Avoid heavy lifting.  · Wear low-heeled shoes, and practice good posture.  · You may continue to have sex unless your health care provider directs you otherwise.  Relief of Pain or Discomfort  · Wear a good support bra for breast tenderness.    · Take warm sitz baths to soothe any pain or discomfort caused by hemorrhoids. Use hemorrhoid cream if your health care provider approves.    · Rest with your legs elevated if you have leg cramps or low back pain.  · If you develop varicose veins in your   legs, wear support hose. Elevate your feet for 15 minutes, 3-4 times a day. Limit salt in your diet.  Prenatal Care  · Schedule your prenatal visits by the twelfth week of pregnancy. They are usually scheduled monthly at first, then more often in the last 2 months before delivery.  · Write down your questions. Take them to your prenatal visits.  · Keep all your prenatal visits as directed by your health care provider.  Safety  · Wear your seat belt at all times when driving.  · Make a list of emergency phone numbers, including numbers for family, friends, the hospital, and police and fire departments.  General Tips  · Ask your health care provider for a referral to a local prenatal education class. Begin classes no later than at the beginning of month 6 of your pregnancy.  · Ask for help if you have counseling or nutritional needs during pregnancy. Your health care provider can offer advice or refer you to specialists for help with various needs.  · Do not use hot tubs, steam rooms, or saunas.  · Do not douche or use tampons or scented sanitary pads.  · Do not cross your legs for long periods of time.  · Avoid cat litter boxes and soil used by cats. These carry germs that can cause birth defects in the baby and possibly loss of the fetus by miscarriage or stillbirth.   · Avoid all smoking, herbs, alcohol, and medicines not prescribed by your health care provider. Chemicals in these affect the formation and growth of the baby.  · Do not use any tobacco products, including cigarettes, chewing tobacco, and electronic cigarettes. If you need help quitting, ask your health care provider. You may receive counseling support and other resources to help you quit.  · Schedule a dentist appointment. At home, brush your teeth with a soft toothbrush and be gentle when you floss.  SEEK MEDICAL CARE IF:   · You have dizziness.  · You have mild pelvic cramps, pelvic pressure, or nagging pain in the abdominal area.  · You have persistent nausea, vomiting, or diarrhea.  · You have a bad smelling vaginal discharge.  · You have pain with urination.  · You notice increased swelling in your face, hands, legs, or ankles.  SEEK IMMEDIATE MEDICAL CARE IF:   · You have a fever.  · You are leaking fluid from your vagina.  · You have spotting or bleeding from your vagina.  · You have severe abdominal cramping or pain.  · You have rapid weight gain or loss.  · You vomit blood or material that looks like coffee grounds.  · You are exposed to German measles and have never had them.  · You are exposed to fifth disease or chickenpox.  · You develop a severe headache.  · You have shortness of breath.  · You have any kind of trauma, such as from a fall or a car accident.     This information is not intended to replace advice given to you by your health care provider. Make sure you discuss any questions you have with your health care provider.     Document Released: 12/11/2001 Document Revised: 01/07/2015 Document Reviewed: 10/27/2013  Elsevier Interactive Patient Education ©2017 [employer]

## 2017-01-28 NOTE — ED Notes (Addendum)
Report called to Belenda CruiseKristin, RN [facility] and [facility] OK per Dr.Ward

## 2017-01-29 LAB — GC/CHLAMYDIA PROBE AMP (~~LOC~~) NOT AT ARMC
CHLAMYDIA, DNA PROBE: NEGATIVE
NEISSERIA GONORRHEA: NEGATIVE

## 2017-02-16 ENCOUNTER — Emergency Department (HOSPITAL_BASED_OUTPATIENT_CLINIC_OR_DEPARTMENT_OTHER)
Admission: EM | Admit: 2017-02-16 | Discharge: 2017-02-16 | Disposition: A | Discharge status: Home / Self Care | Payer: 59 | Attending: Physician Assistant | Admitting: Physician Assistant

## 2017-02-16 ENCOUNTER — Encounter (HOSPITAL_BASED_OUTPATIENT_CLINIC_OR_DEPARTMENT_OTHER): Payer: Self-pay | Admitting: *Deleted

## 2017-02-16 DIAGNOSIS — K047 Periapical abscess without sinus: Secondary | ICD-10-CM | POA: Insufficient documentation

## 2017-02-16 DIAGNOSIS — F17200 Nicotine dependence, unspecified, uncomplicated: Secondary | ICD-10-CM | POA: Diagnosis not present

## 2017-02-16 DIAGNOSIS — Z3A08 8 weeks gestation of pregnancy: Secondary | ICD-10-CM | POA: Diagnosis not present

## 2017-02-16 DIAGNOSIS — O99331 Smoking (tobacco) complicating pregnancy, first trimester: Secondary | ICD-10-CM | POA: Diagnosis not present

## 2017-02-16 DIAGNOSIS — O99611 Diseases of the digestive system complicating pregnancy, first trimester: Secondary | ICD-10-CM | POA: Insufficient documentation

## 2017-02-16 MED ORDER — PENICILLIN V POTASSIUM 500 MG PO TABS: 500.0000 mg | ORAL_TABLET | Freq: Four times a day (QID) | ORAL | 0 refills | Status: AC

## 2017-02-16 NOTE — Discharge Instructions (Signed)
Take the prescribed medication as directed.  Can use topical orajel and tylenol for pain. Follow-up with dentist.  Unfortunately, there is not a dentist on call today.  Use resource guide to help find local clinic in the area for follow-up. Return to the ED for new or worsening symptoms.

## 2017-02-16 NOTE — ED Triage Notes (Signed)
Pt reports right sided facial pain x 1 day.  Unsure of dental pain or tooth pain.

## 2017-02-16 NOTE — ED Provider Notes (Signed)
[facility] Provider Note   CSN: [id number] Arrival date & time: 02/16/17  2046  By signing my name below, I, Octavia Heirrianna Nassar, attest that this documentation has been prepared under the direction and in the presence of Sharilyn SitesLisa Sanders, PA-C.  Electronically Signed: Octavia HeirArianna Nassar, ED Scribe. 02/16/17. 9:56 PM.    History   Chief Complaint Chief Complaint  Patient presents with  . Facial Pain   The history is provided by the patient. No language interpreter was used.   HPI Comments: Cindy Harris is a 27 y.o. female who is currently [redacted] weeks pregnant has a PMhx of anxiety and depression presents to the Emergency Department complaining of right sided facial pain that radiates into her right ear x 1-2 days. She reports associated right upper dental pain. Pt has not followed up with a dentist for her problem. She denies fever and trouble swallowing.    Past Medical History:  Diagnosis Date  . Anxiety   . Depression     There are no active problems to display for this patient.   History reviewed. No pertinent surgical history.  OB History    Gravida Para Term Preterm AB Living   1             SAB TAB Ectopic Multiple Live Births                   Home Medications    Prior to Admission medications   Medication Sig Start Date End Date Taking? Authorizing Provider  Doxylamine-Pyridoxine (DICLEGIS) 10-10 MG TBEC Take by mouth.   Yes Historical Provider, MD  promethazine (PHENERGAN) 25 MG tablet Take 25 mg by mouth every 6 (six) hours as needed for nausea or vomiting.   Yes Historical Provider, MD  dextromethorphan-guaiFENesin (MUCINEX DM) 30-600 MG 12hr tablet Take 1 tablet by mouth 2 (two) times daily as needed for cough. 11/07/16   Araceli Bouchealeigh N Rumley, DO    Family History History reviewed. No pertinent family history.  Social History Social History  Substance Use Topics  . Smoking status: Current Every Day Smoker    Packs/day: 0.50  . Smokeless tobacco: Never  Used  . Alcohol use Yes     Comment: occ     Allergies   Patient has no known allergies.   Review of Systems Review of Systems  Constitutional: Negative for fever.  HENT: Positive for dental problem. Negative for trouble swallowing.   All other systems reviewed and are negative.    Physical Exam Updated Vital Signs BP 115/75 (BP Location: Left Arm)   Pulse 95   Temp 98.2 F (36.8 C) (Oral)   Resp 17   Ht 4\' 11"  (1.499 m)   Wt 97 lb (44 kg)   LMP 10/13/2016   SpO2 100%   BMI 19.59 kg/m   Physical Exam  Constitutional: She is oriented to person, place, and time. She appears well-developed and well-nourished.  HENT:  Head: Normocephalic and atraumatic.  Mouth/Throat: Oropharynx is clear and moist.  Teeth largely in very poor dentition, right upper molars are all broken and decayed, surrounding gingiva swollen with apparent, small dental abscess, handling secretions appropriately, no trismus, no facial or neck swelling, normal phonation without stridor  Eyes: Conjunctivae and EOM are normal. Pupils are equal, round, and reactive to light.  Neck: Normal range of motion.  Cardiovascular: Normal rate, regular rhythm and normal heart sounds.   Pulmonary/Chest: Effort normal and breath sounds normal. No respiratory distress.  Abdominal: Soft.  Bowel sounds are normal.  Musculoskeletal: Normal range of motion.  Neurological: She is alert and oriented to person, place, and time.  Skin: Skin is warm and dry.  Psychiatric: She has a normal mood and affect.  Nursing note and vitals reviewed.    ED Treatments / Results  DIAGNOSTIC STUDIES: Oxygen Saturation is 100% on RA, normal by my interpretation.  COORDINATION OF CARE:  9:54 PM Discussed treatment plan with pt at bedside and pt agreed to plan.  Labs (all labs ordered are listed, but only abnormal results are displayed) Labs Reviewed - No data to display  EKG  EKG Interpretation None       Radiology No results  found.  Procedures Procedures (including critical care time)  Medications Ordered in ED Medications - No data to display   Initial Impression / Assessment and Plan / ED Course  I have reviewed the triage vital signs and the nursing notes.  Pertinent labs & imaging results that were available during my care of the patient were reviewed by me and considered in my medical decision making (see chart for details).  27 year old female here with right facial pain. Reports associated right upper dental pain. She does have an abscess on exam without drainable fluid collection. She has no facial or neck swelling. She is handling her secretions well. Normal phonation without stridor. Not clinically concerning for Ludwig's angina. Will plan to discharge home on antibiotics, continue Tylenol for pain control given pregnancy. She was given dental resource guide for follow-up as there is no dentist on call today.  Discussed plan with patient, she acknowledged understanding and agreed with plan of care.  Return precautions given for new or worsening symptoms.  Final Clinical Impressions(s) / ED Diagnoses   Final diagnoses:  Dental abscess    New Prescriptions New Prescriptions   PENICILLIN V POTASSIUM (VEETID) 500 MG TABLET    Take 1 tablet (500 mg total) by mouth 4 (four) times daily.   I personally performed the services described in this documentation, which was scribed in my presence. The recorded information has been reviewed and is accurate.   Garlon Hatchet, PA-C 02/16/17 2217    Courteney Randall An, MD 02/16/17 [phone]

## 2017-03-22 IMAGING — US US TRANSVAGINAL NON-OB
1 series · 14 of 25 positions shown · non-contrast
Comparison: None

CLINICAL DATA: 25-year-old female with left pelvic pain.

EXAM:
TRANSABDOMINAL AND TRANSVAGINAL ULTRASOUND OF PELVIS
TECHNIQUE: Both transabdominal and transvaginal ultrasound examinations of the
pelvis were performed. Transabdominal technique was performed for
global imaging of the pelvis including uterus, ovaries, adnexal
regions, and pelvic cul-de-sac. It was necessary to proceed with
endovaginal exam following the transabdominal exam to visualize the
endometrium and the ovaries.

[Series 1: us transvaginal non-ob · 0.21mm/px · 14 of 41 slices shown]
[im 1/41]
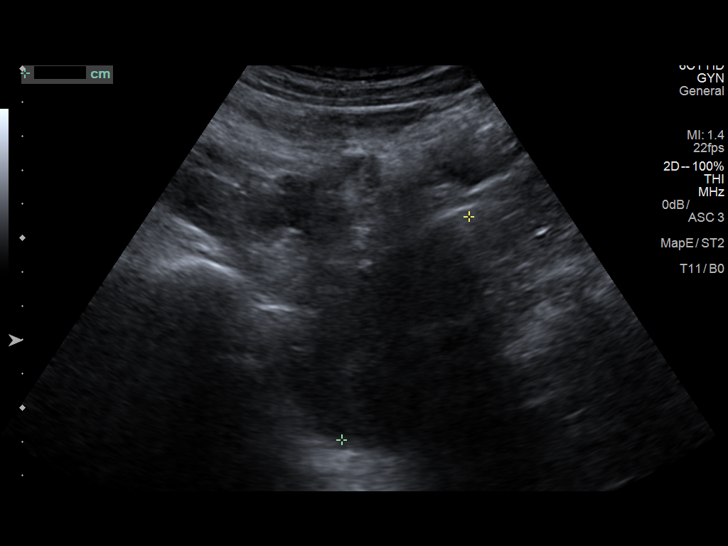
[im 4/41]
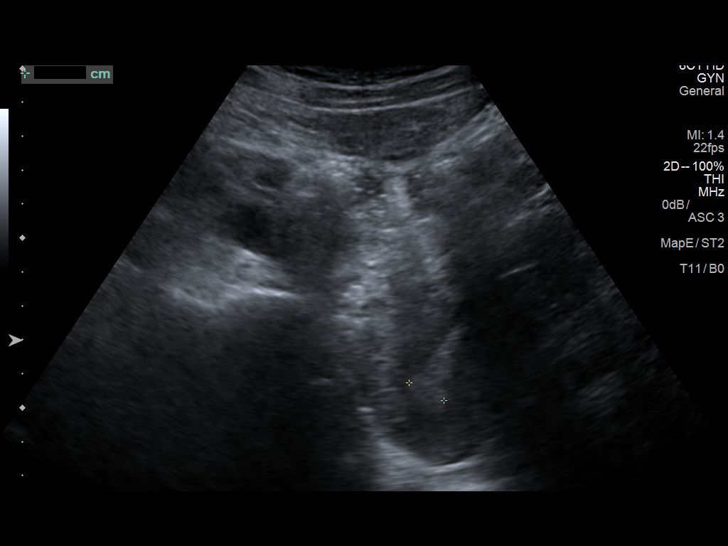
[im 7/41]
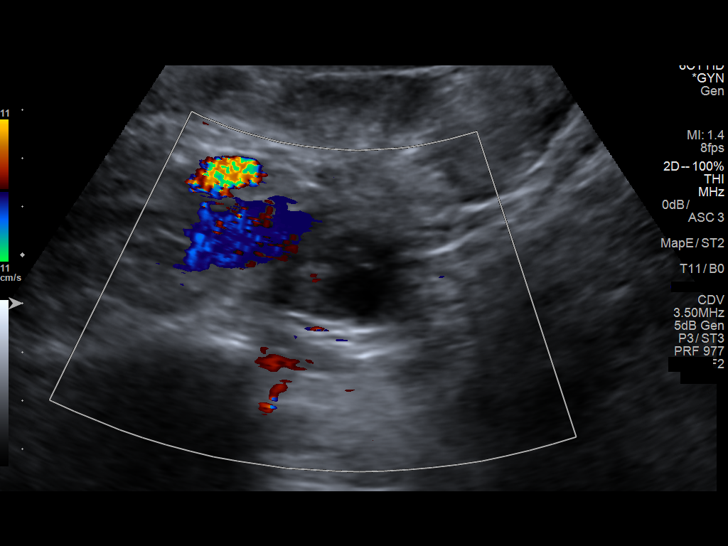
[im 11/41]
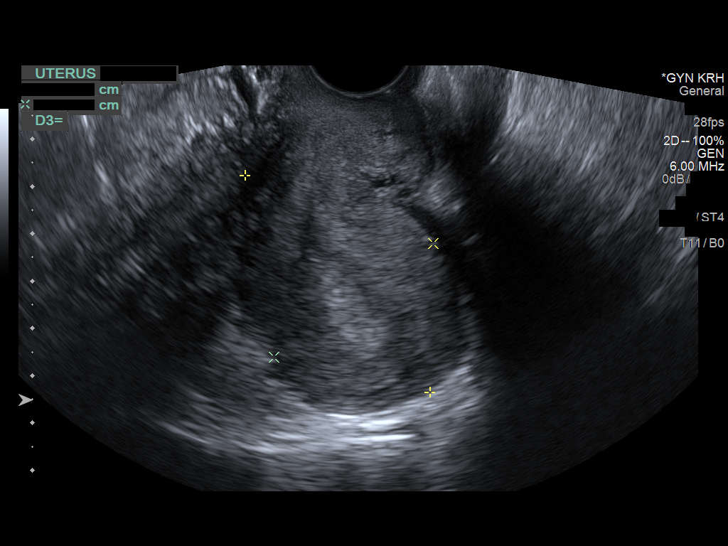
[im 14/41]
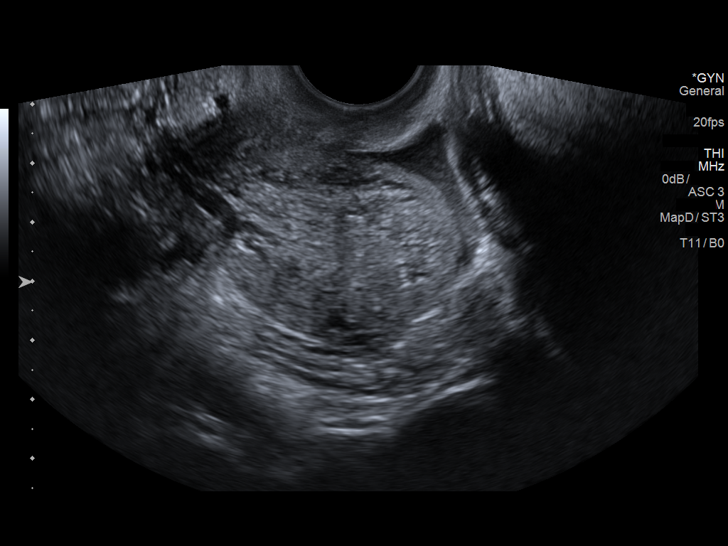
[im 16/41]
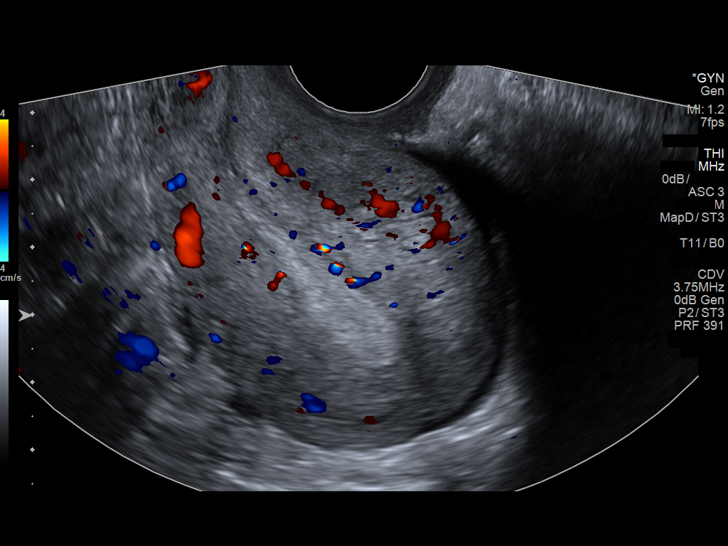
[im 19/41]
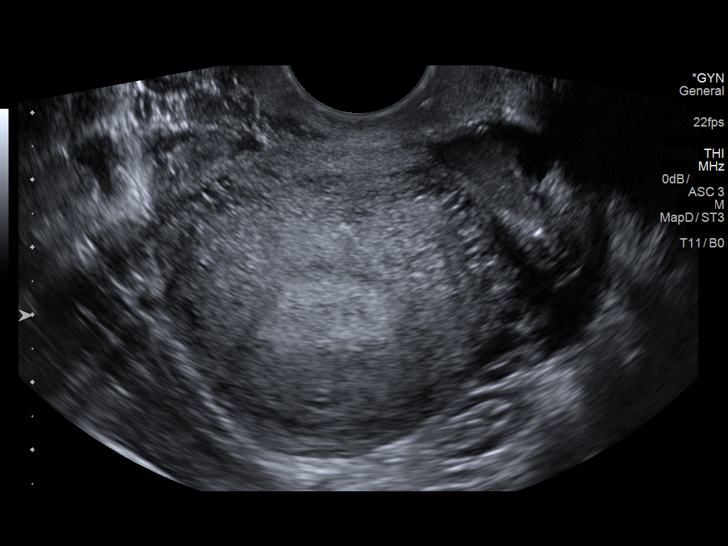
[im 22/41]
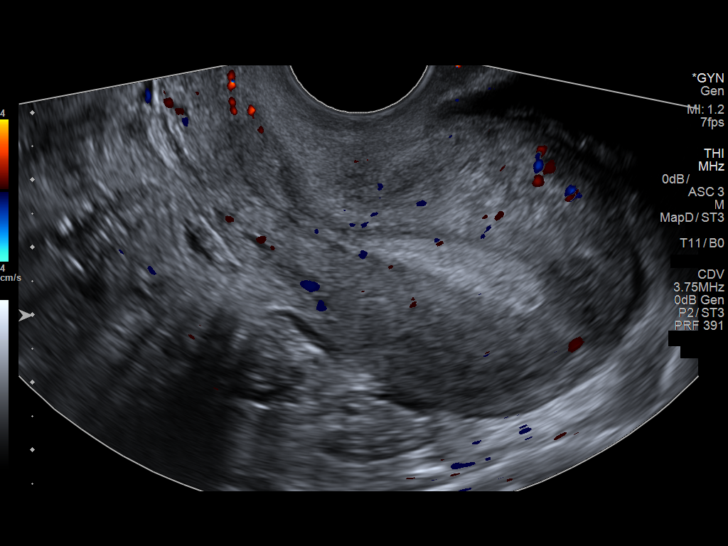
[im 26/41]
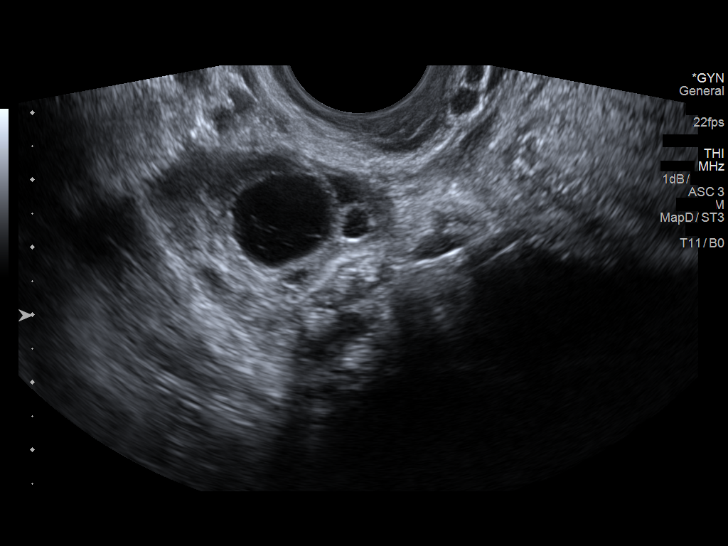
[im 27/41]
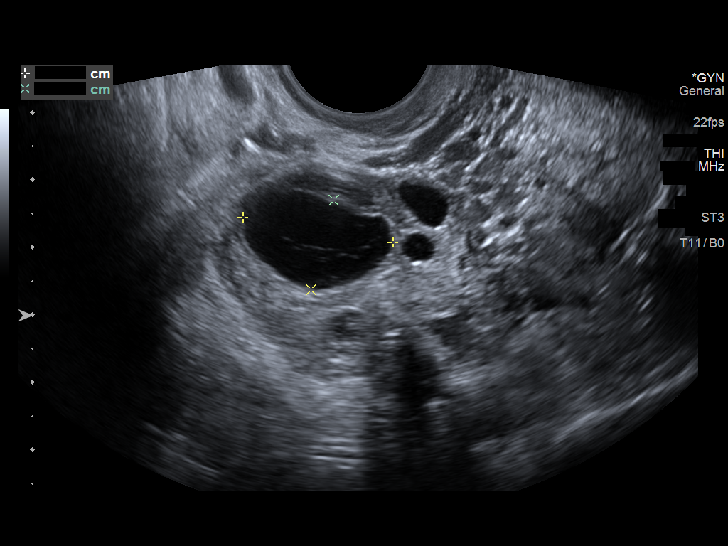
[im 31/41]
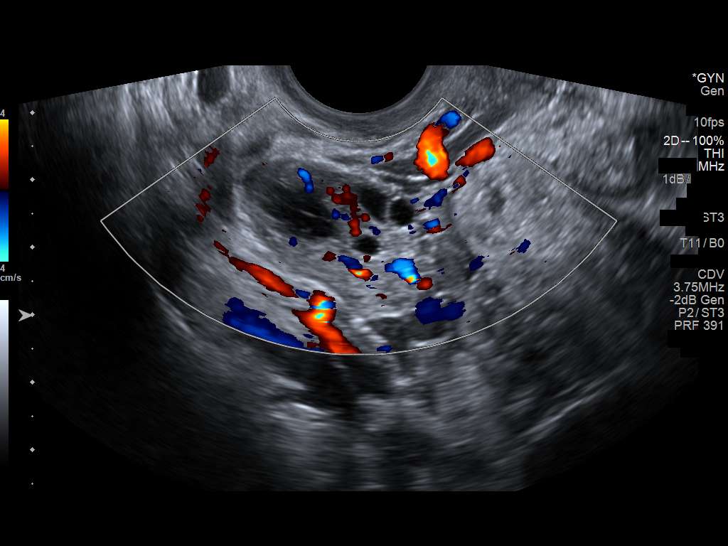
[im 34/41]
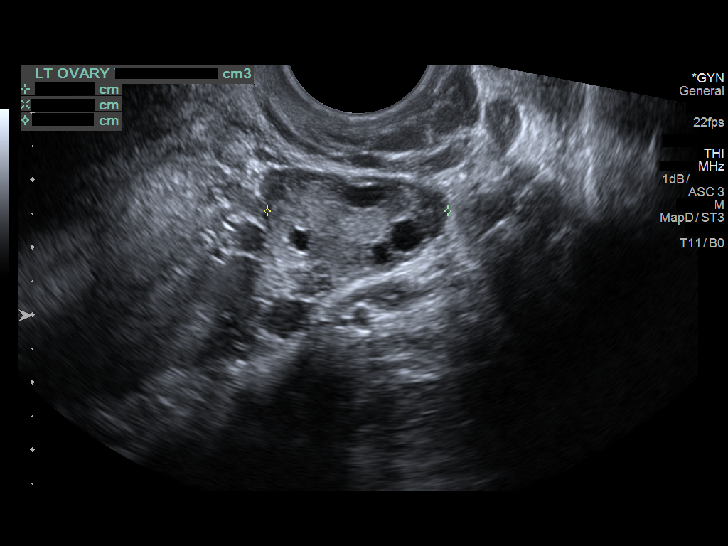
[im 37/41]
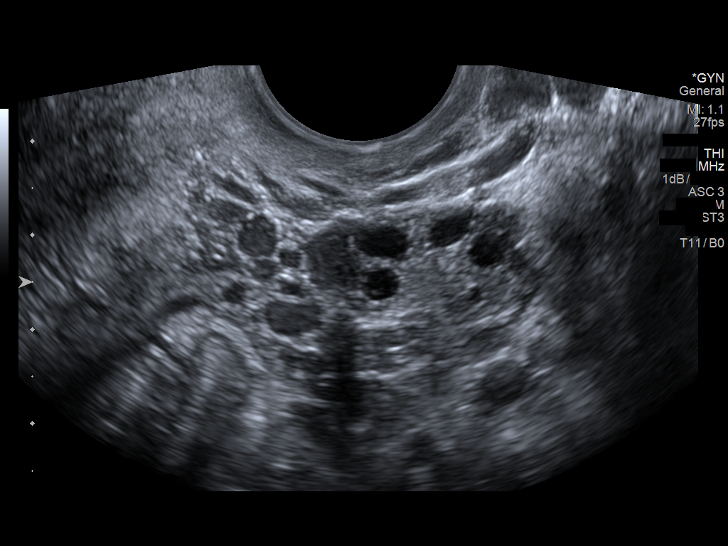
[im 41/41]
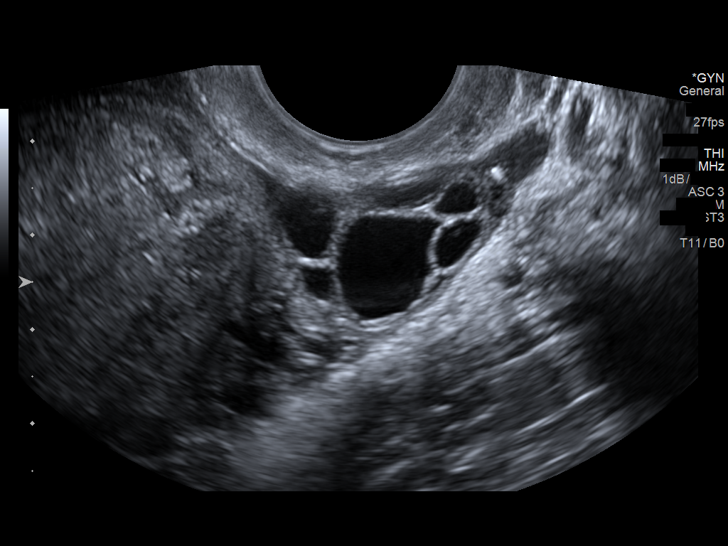

[14 of 25 positions shown; findings below may reference images not displayed]

FINDINGS: Uterus

Measurements: Retroverted measuring 6 x 4 x 5 cm. No fibroids or
other mass visualized.

Endometrium

Thickness: 1 mm.  No focal abnormality visualized.

Right ovary

Measurements: 4.0 x 2.7 x 3.5 cm.. A 2.3 x 1.4 x 1.7 cm complex
cyst/ follicle.

Left ovary

Measurements: 4.0 x 2.0 x 2.7 cm. Normal appearance/no adnexal mass.

Other findings

Small amount of complex fluid with low-level echogenic debris noted
predominantly surrounding the left ovary.
IMPRESSION: Small right ovarian complex/ hemorrhagic cyst and small amount of
complex fluid within the pelvis. Unremarkable uterus and left ovary.

## 2017-03-25 ENCOUNTER — Emergency Department (HOSPITAL_BASED_OUTPATIENT_CLINIC_OR_DEPARTMENT_OTHER)
Admission: EM | Admit: 2017-03-25 | Discharge: 2017-03-26 | Disposition: A | Discharge status: Home / Self Care | Payer: 59 | Attending: Emergency Medicine | Admitting: Emergency Medicine

## 2017-03-25 ENCOUNTER — Encounter (HOSPITAL_BASED_OUTPATIENT_CLINIC_OR_DEPARTMENT_OTHER): Payer: Self-pay

## 2017-03-25 DIAGNOSIS — O99331 Smoking (tobacco) complicating pregnancy, first trimester: Secondary | ICD-10-CM | POA: Diagnosis not present

## 2017-03-25 DIAGNOSIS — Z3A13 13 weeks gestation of pregnancy: Secondary | ICD-10-CM | POA: Diagnosis not present

## 2017-03-25 DIAGNOSIS — R197 Diarrhea, unspecified: Secondary | ICD-10-CM | POA: Diagnosis not present

## 2017-03-25 DIAGNOSIS — Z79899 Other long term (current) drug therapy: Secondary | ICD-10-CM | POA: Insufficient documentation

## 2017-03-25 DIAGNOSIS — F17200 Nicotine dependence, unspecified, uncomplicated: Secondary | ICD-10-CM | POA: Insufficient documentation

## 2017-03-25 DIAGNOSIS — O219 Vomiting of pregnancy, unspecified: Secondary | ICD-10-CM | POA: Insufficient documentation

## 2017-03-25 DIAGNOSIS — O26891 Other specified pregnancy related conditions, first trimester: Secondary | ICD-10-CM | POA: Diagnosis not present

## 2017-03-25 DIAGNOSIS — R112 Nausea with vomiting, unspecified: Secondary | ICD-10-CM

## 2017-03-25 LAB — CBC
HCT: 37.1 % (ref 36.0–46.0)
Hemoglobin: 13.4 g/dL (ref 12.0–15.0)
MCH: 32.1 pg (ref 26.0–34.0)
MCHC: 36.1 g/dL — ABNORMAL HIGH (ref 30.0–36.0)
MCV: 88.8 fL (ref 78.0–100.0)
PLATELETS: 222 10*3/uL (ref 150–400)
RBC: 4.18 MIL/uL (ref 3.87–5.11)
RDW: 12 % (ref 11.5–15.5)
WBC: 10.5 10*3/uL (ref 4.0–10.5)

## 2017-03-25 LAB — COMPREHENSIVE METABOLIC PANEL
ALK PHOS: 52 U/L (ref 38–126)
ALT: 15 U/L (ref 14–54)
AST: 20 U/L (ref 15–41)
Albumin: 3.4 g/dL — ABNORMAL LOW (ref 3.5–5.0)
Anion gap: 9 (ref 5–15)
BILIRUBIN TOTAL: 0.4 mg/dL (ref 0.3–1.2)
BUN: 11 mg/dL (ref 6–20)
CALCIUM: 8.8 mg/dL — AB (ref 8.9–10.3)
CHLORIDE: 104 mmol/L (ref 101–111)
CO2: 20 mmol/L — ABNORMAL LOW (ref 22–32)
CREATININE: 0.45 mg/dL (ref 0.44–1.00)
GFR calc Af Amer: >60 mL/min (ref >60)
GFR calc non Af Amer: >60 mL/min (ref >60)
Glucose, Bld: 101 mg/dL — ABNORMAL HIGH (ref 65–99)
Potassium: 3.3 mmol/L — ABNORMAL LOW (ref 3.5–5.1)
Sodium: 133 mmol/L — ABNORMAL LOW (ref 135–145)
Total Protein: 6.8 g/dL (ref 6.5–8.1)

## 2017-03-25 LAB — URINALYSIS, ROUTINE W REFLEX MICROSCOPIC
Bilirubin Urine: NEGATIVE
GLUCOSE, UA: NEGATIVE mg/dL
HGB URINE DIPSTICK: NEGATIVE
KETONES UR: 15 mg/dL — AB
Nitrite: NEGATIVE
Protein, ur: NEGATIVE mg/dL
Specific Gravity, Urine: 1.029 (ref 1.005–1.030)
pH: 7.5 (ref 5.0–8.0)

## 2017-03-25 LAB — URINALYSIS, MICROSCOPIC (REFLEX)

## 2017-03-25 LAB — LIPASE, BLOOD: Lipase: 16 U/L (ref 11–51)

## 2017-03-25 MED ORDER — ONDANSETRON HCL 4 MG/2ML IJ SOLN
4.0000 mg | Freq: Once | INTRAMUSCULAR | Status: AC
  Administered 2017-03-25: 4 mg via INTRAVENOUS
  Filled 2017-03-25: qty 2

## 2017-03-25 MED ORDER — LACTATED RINGERS IV SOLN
INTRAVENOUS | Status: AC
  Administered 2017-03-25: 23:00:00 via INTRAVENOUS

## 2017-03-25 NOTE — ED Provider Notes (Signed)
[facility]   CSN: [id number] Arrival date & time: 03/25/17  2006   By signing my name below, I, Talbert Nan, attest that this documentation has been prepared under the direction and in the presence of Kerrie Buffalo, NP. Electronically Signed: Talbert Nan, Scribe. 03/25/17. 10:03 PM.    History   Chief Complaint Chief Complaint  Patient presents with  . Emesis    HPI Cindy Harris is a 27 y.o. female G1P0 @ [redacted]w[redacted]d  who presents to the Emergency Department complaining of multiple episodes of emesis which began this morning. Pt has associated body aches, diarrhea, dehydration. Pt states that it feels viral and not like morning sickness vomiting. Pt took fenergan PTA. Pt denies ear ache, sore throat, cough, congestion, urinary symptoms, abdominal pain.   The history is provided by the patient. No language interpreter was used.    Past Medical History:  Diagnosis Date  . Anxiety   . Depression     There are no active problems to display for this patient.   History reviewed. No pertinent surgical history.  OB History    Gravida Para Term Preterm AB Living   1             SAB TAB Ectopic Multiple Live Births                   Home Medications    Prior to Admission medications   Medication Sig Start Date End Date Taking? Authorizing Provider  promethazine (PHENERGAN) 25 MG tablet Take 25 mg by mouth every 6 (six) hours as needed for nausea or vomiting.   Yes Historical Provider, MD  sertraline (ZOLOFT) 50 MG tablet Take 50 mg by mouth daily.   Yes Historical Provider, MD  Doxylamine-Pyridoxine (DICLEGIS) 10-10 MG TBEC Take by mouth.    Historical Provider, MD  ondansetron (ZOFRAN) 4 MG tablet Take 1 tablet (4 mg total) by mouth every 6 (six) hours. 03/26/17   Bader Stubblefield Orlene Och, NP  penicillin v potassium (VEETID) 500 MG tablet Take 1 tablet (500 mg total) by mouth 4 (four) times daily. 02/16/17   Garlon Hatchet, PA-C    Family History No family  history on file.  Social History Social History  Substance Use Topics  . Smoking status: Current Every Day Smoker    Packs/day: 0.50  . Smokeless tobacco: Never Used  . Alcohol use Yes     Comment: occ     Allergies   Patient has no known allergies.   Review of Systems Review of Systems  Constitutional: Negative for chills and fever.  HENT: Negative for congestion, ear pain and sore throat.   Respiratory: Negative for cough and shortness of breath.   Cardiovascular: Negative for leg swelling.  Gastrointestinal: Positive for diarrhea and vomiting. Negative for abdominal pain.  Genitourinary: Negative for difficulty urinating, dysuria, flank pain, urgency, vaginal bleeding and vaginal discharge.  Musculoskeletal: Positive for arthralgias and myalgias.  Skin: Negative for rash.  Neurological: Negative for syncope and headaches.  Psychiatric/Behavioral: The patient is not nervous/anxious.      Physical Exam Updated Vital Signs BP (!) 103/57 (BP Location: Left Arm)   Pulse 98   Temp 98 F (36.7 C) (Oral)   Resp 16   Ht 4\' 11"  (1.499 m)   Wt 44.5 kg   LMP 10/13/2016   SpO2 99%   BMI 19.79 kg/m   Physical Exam  Constitutional: She is oriented to person, place, and time. She appears  well-developed and well-nourished. No distress.  HENT:  Head: Normocephalic and atraumatic.  Eyes: EOM are normal.  Neck: Normal range of motion. Neck supple.  Cardiovascular: Normal rate and regular rhythm.   Pulmonary/Chest: Effort normal and breath sounds normal. She has no wheezes. She has no rales.  Abdominal: Soft. Bowel sounds are normal. There is no CVA tenderness.  Doppler FHT 154  Genitourinary:  Genitourinary Comments: Normal pregnancy discharge. No vaginal bleeding.  Neurological: She is alert and oriented to person, place, and time.  Skin: Skin is warm and dry.  Psychiatric: She has a normal mood and affect. Her behavior is normal.  Nursing note and vitals  reviewed.    ED Treatments / Results   DIAGNOSTIC STUDIES: Oxygen Saturation is 99% on room air, normal by my interpretation.    COORDINATION OF CARE: 9:24 PM Discussed treatment plan with pt at bedside and pt agreed to plan, which includes UA.    Labs (all labs ordered are listed, but only abnormal results are displayed) Labs Reviewed  COMPREHENSIVE METABOLIC PANEL - Abnormal; Notable for the following:       Result Value   Sodium 133 (*)    Potassium 3.3 (*)    CO2 20 (*)    Glucose, Bld 101 (*)    Calcium 8.8 (*)    Albumin 3.4 (*)    All other components within normal limits  CBC - Abnormal; Notable for the following:    MCHC 36.1 (*)    All other components within normal limits  URINALYSIS, ROUTINE W REFLEX MICROSCOPIC - Abnormal; Notable for the following:    APPearance CLOUDY (*)    Ketones, ur 15 (*)    Leukocytes, UA SMALL (*)    All other components within normal limits  URINALYSIS, MICROSCOPIC (REFLEX) - Abnormal; Notable for the following:    Bacteria, UA MANY (*)    Squamous Epithelial / LPF 6-30 (*)    All other components within normal limits  LIPASE, BLOOD    Radiology No results found.  Procedures Procedures (including critical care time)  Medications Ordered in ED Medications  lactated ringers infusion ( Intravenous Stopped 03/25/17 2343)  ondansetron (ZOFRAN) injection 4 mg (4 mg Intravenous Given 03/25/17 2247)     Initial Impression / Assessment and Plan / ED Course   MDM  I have reviewed the triage vital signs and the nursing notes.  Pertinent lab results that were available during my care of the patient were reviewed by me and considered in my medical decision making (see chart for details).   Final Clinical Impressions(s) / ED Diagnoses  27 y.o. female @ [redacted]w[redacted]d gestation with n/v/d stable for d/c without fever and does not appear toxic. No abdominal tenderness, no OB complaints. Will treat for nausea and give diet to follow while  having diarrhea. Patient will return as needed for worsening symptoms. After IV fluids and medications patient is taking PO fluids without difficulty.  Final diagnoses:  Nausea vomiting and diarrhea  Nausea and vomiting during pregnancy prior to [redacted] weeks gestation    New Prescriptions Discharge Medication List as of 03/26/2017 12:45 AM    START taking these medications   Details  ondansetron (ZOFRAN) 4 MG tablet Take 1 tablet (4 mg total) by mouth every 6 (six) hours., Starting Tue 03/26/2017, Print      I personally performed the services described in this documentation, which was scribed in my presence. The recorded information has been reviewed and is accurate.  [address] Janaki Exley, [place] 03/26/17 [id number]    Benjiman CoreNathan Pickering, MD 03/26/17 [phone]

## 2017-03-25 NOTE — ED Triage Notes (Signed)
Pt c/o n/v/d and bodyaches that started this morning, she is [redacted] weeks pregnant, no lower abdominal pain, no bleeding, pt took phenergan at 1830 and has not vomited since that dose.

## 2017-03-25 NOTE — ED Notes (Signed)
Tolerating PO fluids in lobby

## 2017-03-26 MED ORDER — ONDANSETRON HCL 4 MG PO TABS: 4.0000 mg | ORAL_TABLET | Freq: Four times a day (QID) | ORAL | 0 refills | Status: AC

## 2017-03-26 NOTE — Discharge Instructions (Signed)
Stay on clear liquids tonight and then advance slowly to the diet to help with diarrhea. Follow up with your doctor. Return here as needed.

## 2017-12-20 ENCOUNTER — Encounter (HOSPITAL_COMMUNITY): Payer: Self-pay

## 2018-01-31 IMAGING — US US OB COMP LESS 14 WK
1 series · 15 of 28 positions shown · non-contrast
Comparison: None.

CLINICAL DATA: Pregnant patient in first-trimester pregnancy with
pelvic pain. Vaginal bleeding.

EXAM:
OBSTETRIC <14 WK US AND TRANSVAGINAL OB US
TECHNIQUE: Both transabdominal and transvaginal ultrasound examinations were
performed for complete evaluation of the gestation as well as the
maternal uterus, adnexal regions, and pelvic cul-de-sac.
Transvaginal technique was performed to assess early pregnancy.

[Series 1: us ob comp less 14 wk · 33 acquisitions, 15 frames shown]
[im 1/33]
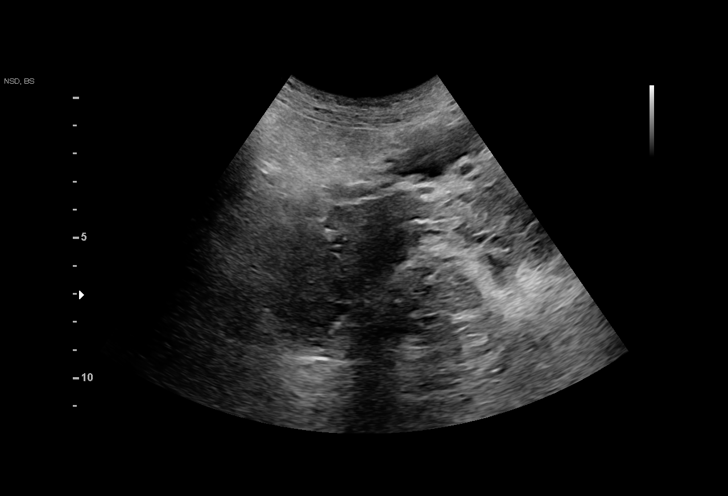
[im 3/33]
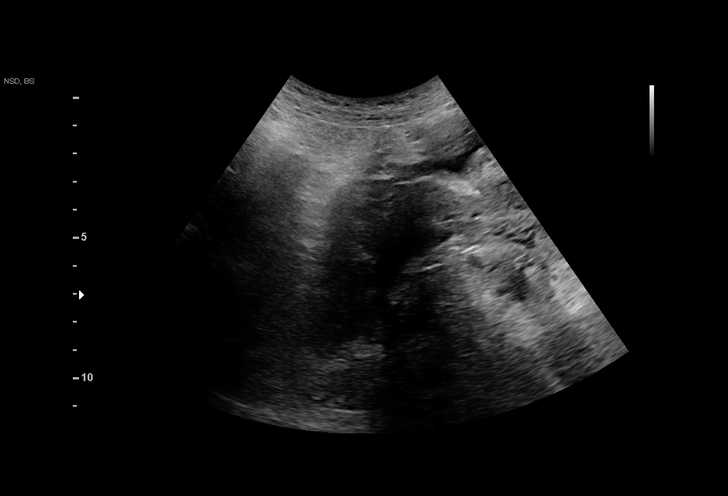
[im 5/33]
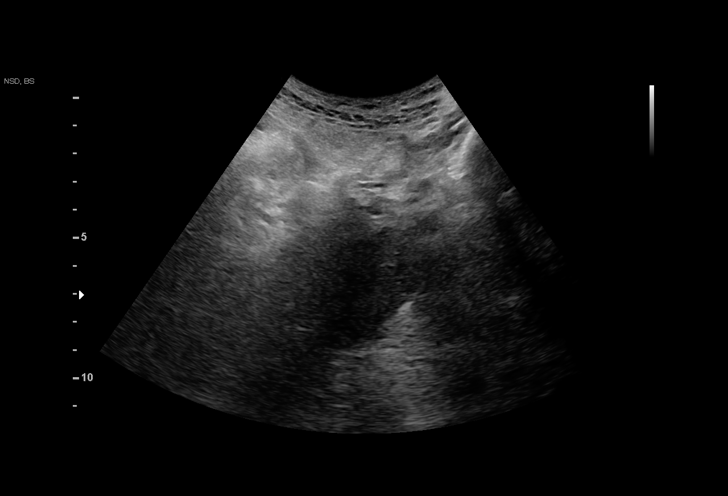
[im 8/33]
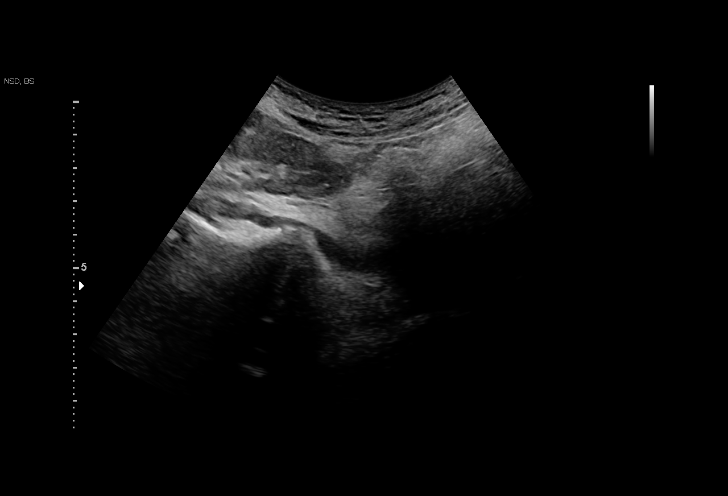
[im 10/33]
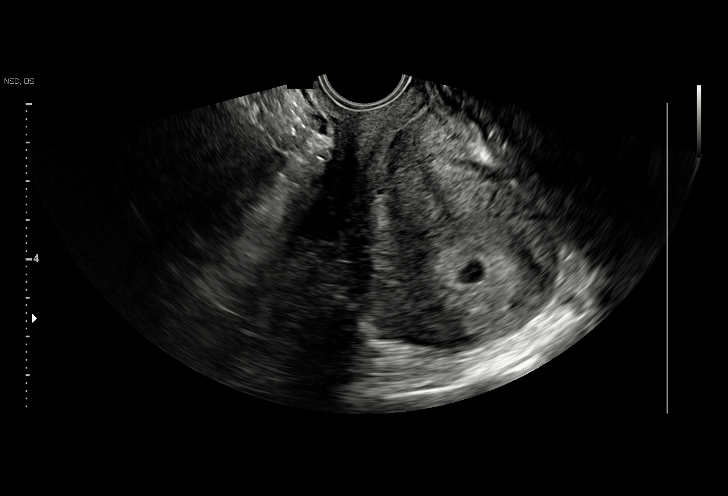
[im 12/33]
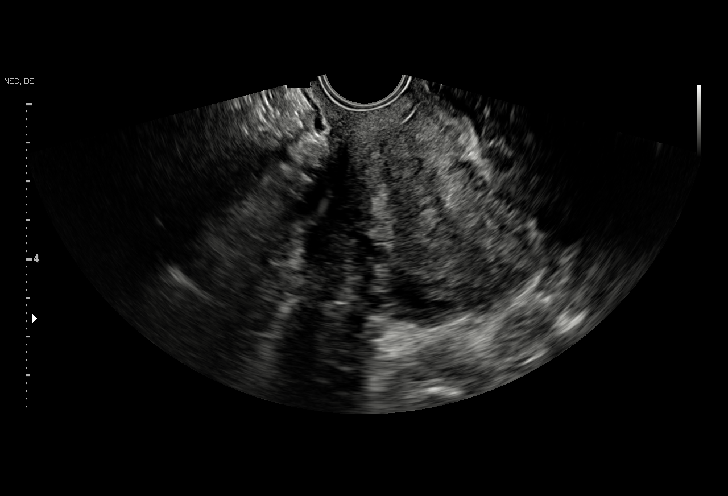
[im 15/33]
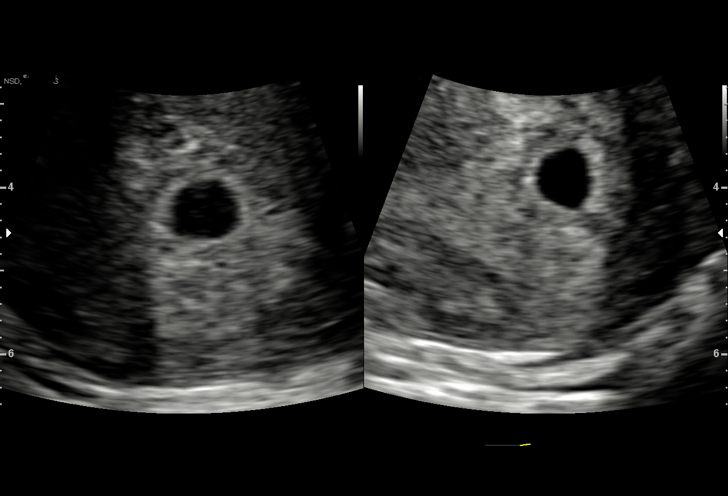
[im 17/33]
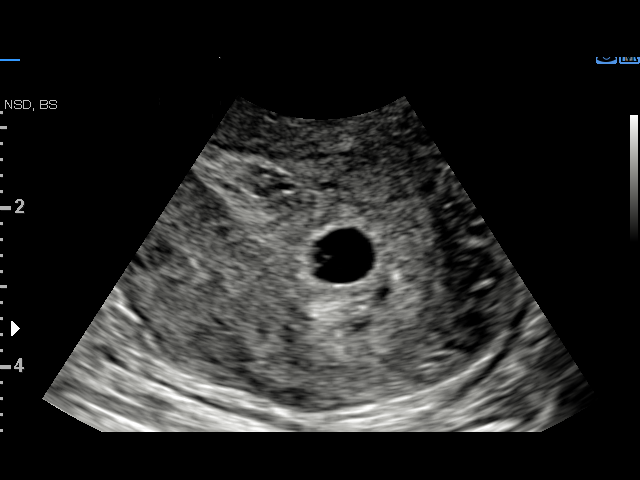
[im 18/33]
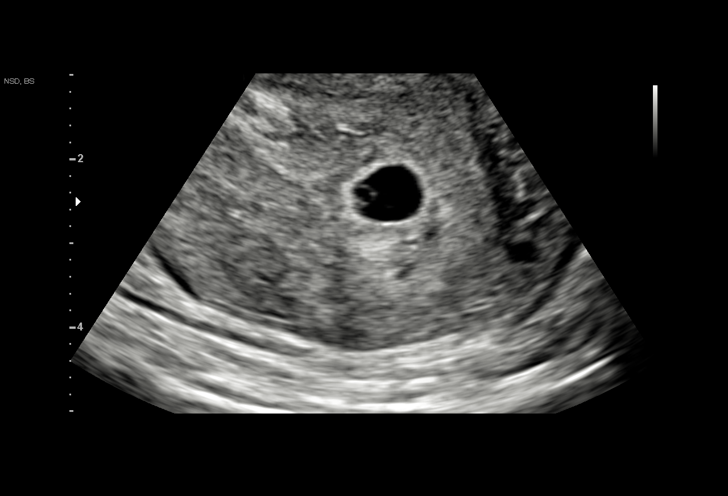
[im 21/33]
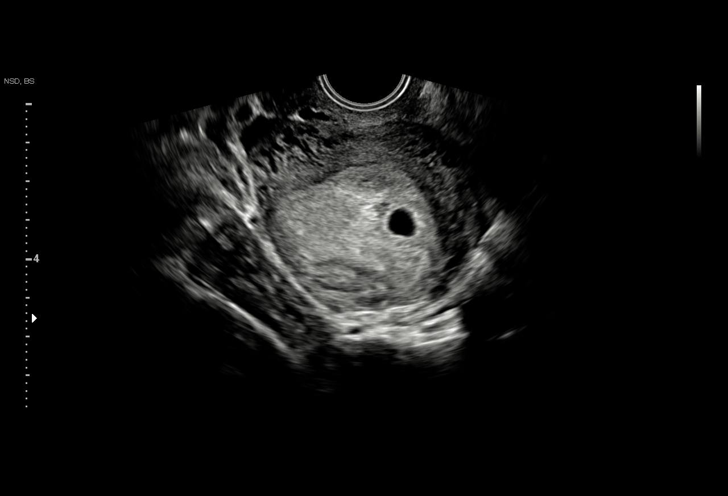
[im 23/33]
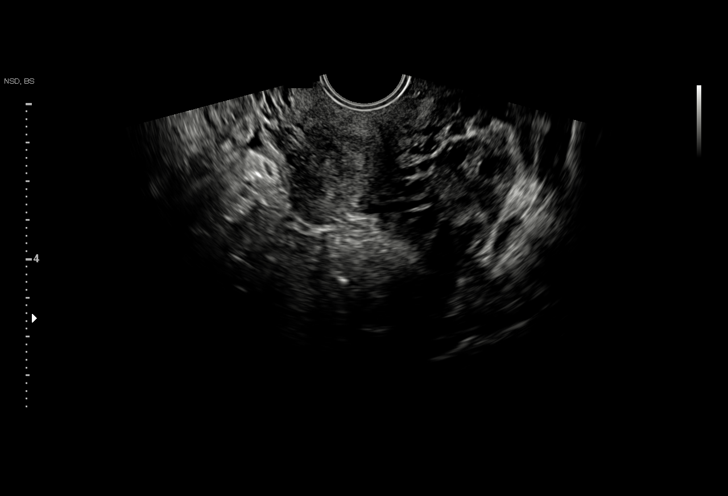
[im 25/33]
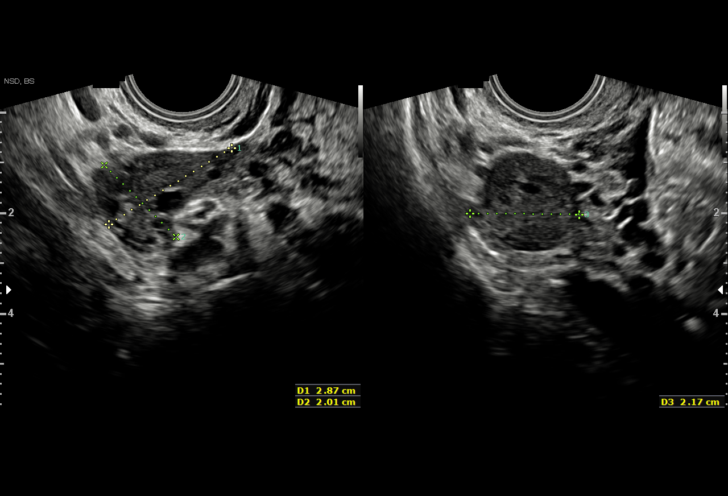
[im 28/33]
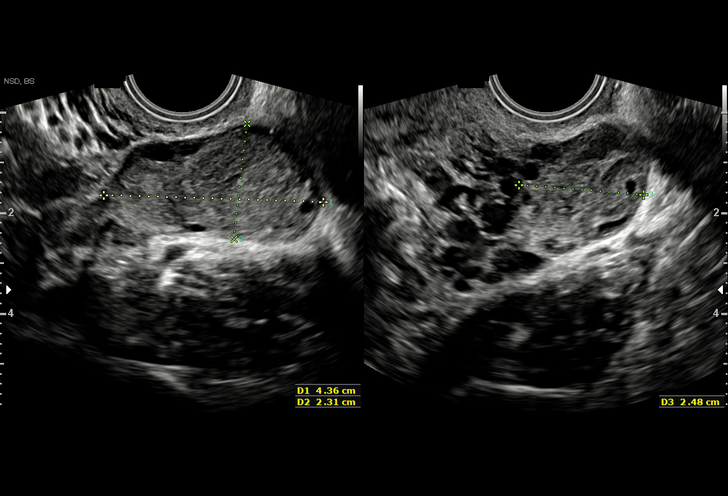
[im 30/33]
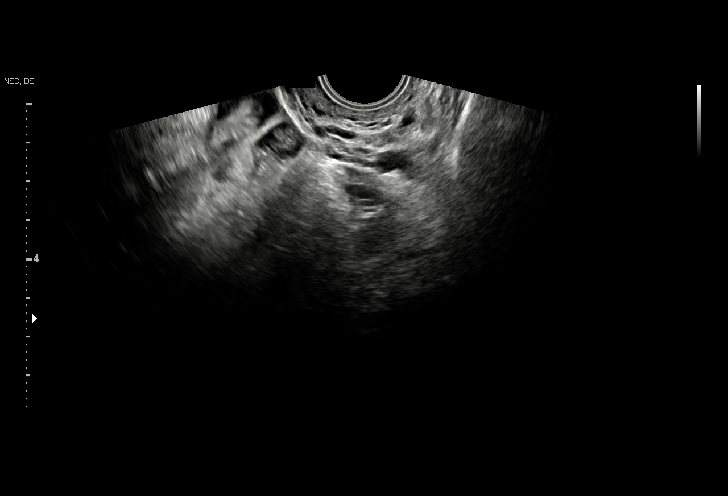
[im 33/33]
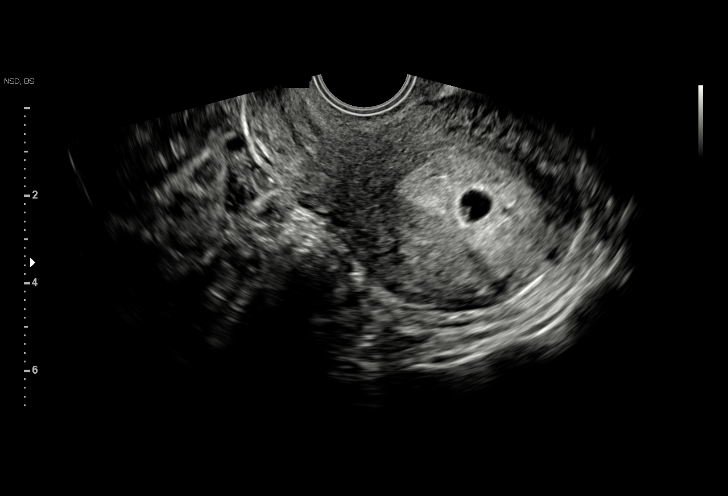

[15 of 28 positions shown; findings below may reference images not displayed]

FINDINGS: Intrauterine gestational sac: Single

Yolk sac:  Visualized.

Embryo:  Not Visualized.

Cardiac Activity: Not Visualized.

MSD: 7.4  mm   5 w   3  d

Subchorionic hemorrhage:  None visualized.

Maternal uterus/adnexae: Both ovaries are visualized and are normal.
Probable corpus luteal cyst on the left ovary. No pelvic free fluid.
IMPRESSION: Probable early intrauterine gestational sac containing a yolk sac,
but no fetal pole or cardiac activity yet visualized. Recommend
follow-up quantitative B-HCG levels and follow-up US in 10-14 days
to assess viability. This recommendation follows SRU consensus
guidelines: Diagnostic Criteria for Nonviable Pregnancy Early in the
First Trimester. N Engl J Med 7890; [DATE].
